# Patient Record
Sex: Female | Born: 1985 | Race: White | Hispanic: No | Marital: Married | Smoking: Never smoker
Health system: Southern US, Community
[De-identification: ages and names within clinical notes are randomized; demographics above are authoritative.]

## PROBLEM LIST (undated history)

## (undated) DIAGNOSIS — F988 Other specified behavioral and emotional disorders with onset usually occurring in childhood and adolescence: Secondary | ICD-10-CM

## (undated) DIAGNOSIS — E039 Hypothyroidism, unspecified: Secondary | ICD-10-CM

## (undated) DIAGNOSIS — R519 Headache, unspecified: Secondary | ICD-10-CM

## (undated) DIAGNOSIS — Z8489 Family history of other specified conditions: Secondary | ICD-10-CM

## (undated) DIAGNOSIS — R278 Other lack of coordination: Secondary | ICD-10-CM

## (undated) DIAGNOSIS — N809 Endometriosis, unspecified: Secondary | ICD-10-CM

## (undated) HISTORY — DX: Endometriosis, unspecified: N80.9

## (undated) HISTORY — PX: OTHER SURGICAL HISTORY: SHX169

## (undated) HISTORY — DX: Hypothyroidism, unspecified: E03.9

## (undated) HISTORY — DX: Other specified behavioral and emotional disorders with onset usually occurring in childhood and adolescence: F98.8

## (undated) HISTORY — DX: Other lack of coordination: R27.8

## (undated) HISTORY — DX: Family history of other specified conditions: Z84.89

## (undated) HISTORY — PX: BREAST SURGERY: SHX581

## (undated) HISTORY — DX: Headache, unspecified: R51.9

---

## 2020-09-05 ENCOUNTER — Inpatient Hospital Stay (HOSPITAL_COMMUNITY): Admit: 2020-09-05 | Payer: BC Managed Care – PPO | Admitting: Obstetrics & Gynecology

## 2020-10-11 ENCOUNTER — Other Ambulatory Visit: Payer: Self-pay | Admitting: Obstetrics & Gynecology

## 2020-10-11 DIAGNOSIS — O283 Abnormal ultrasonic finding on antenatal screening of mother: Secondary | ICD-10-CM

## 2020-10-19 ENCOUNTER — Encounter: Payer: Self-pay | Admitting: *Deleted

## 2020-10-24 ENCOUNTER — Ambulatory Visit: Payer: BC Managed Care – PPO | Admitting: *Deleted

## 2020-10-24 ENCOUNTER — Encounter: Payer: Self-pay | Admitting: *Deleted

## 2020-10-24 ENCOUNTER — Ambulatory Visit: Payer: BC Managed Care – PPO

## 2020-10-24 ENCOUNTER — Other Ambulatory Visit: Payer: Self-pay | Admitting: *Deleted

## 2020-10-24 ENCOUNTER — Other Ambulatory Visit: Payer: Self-pay

## 2020-10-24 ENCOUNTER — Ambulatory Visit: Payer: BC Managed Care – PPO | Attending: Obstetrics & Gynecology

## 2020-10-24 VITALS — BP 131/76 | HR 98 | Ht 66.5 in

## 2020-10-24 DIAGNOSIS — O09522 Supervision of elderly multigravida, second trimester: Secondary | ICD-10-CM

## 2020-10-24 DIAGNOSIS — Z3683 Encounter for fetal screening for congenital cardiac abnormalities: Secondary | ICD-10-CM

## 2020-10-24 DIAGNOSIS — O99282 Endocrine, nutritional and metabolic diseases complicating pregnancy, second trimester: Secondary | ICD-10-CM

## 2020-10-24 DIAGNOSIS — E669 Obesity, unspecified: Secondary | ICD-10-CM | POA: Diagnosis not present

## 2020-10-24 DIAGNOSIS — O283 Abnormal ultrasonic finding on antenatal screening of mother: Secondary | ICD-10-CM | POA: Diagnosis present

## 2020-10-24 DIAGNOSIS — E039 Hypothyroidism, unspecified: Secondary | ICD-10-CM

## 2020-10-24 DIAGNOSIS — O99212 Obesity complicating pregnancy, second trimester: Secondary | ICD-10-CM | POA: Diagnosis not present

## 2020-10-24 DIAGNOSIS — O09512 Supervision of elderly primigravida, second trimester: Secondary | ICD-10-CM | POA: Diagnosis present

## 2020-10-24 DIAGNOSIS — Z362 Encounter for other antenatal screening follow-up: Secondary | ICD-10-CM

## 2020-10-24 DIAGNOSIS — Z3A25 25 weeks gestation of pregnancy: Secondary | ICD-10-CM

## 2020-10-24 DIAGNOSIS — Z363 Encounter for antenatal screening for malformations: Secondary | ICD-10-CM

## 2020-11-05 ENCOUNTER — Encounter: Payer: Self-pay | Admitting: *Deleted

## 2020-11-05 ENCOUNTER — Ambulatory Visit: Payer: BC Managed Care – PPO | Attending: Obstetrics and Gynecology

## 2020-11-05 ENCOUNTER — Ambulatory Visit: Payer: BC Managed Care – PPO | Admitting: *Deleted

## 2020-11-05 ENCOUNTER — Other Ambulatory Visit: Payer: Self-pay

## 2020-11-05 VITALS — BP 133/81 | HR 98

## 2020-11-05 DIAGNOSIS — O99212 Obesity complicating pregnancy, second trimester: Secondary | ICD-10-CM | POA: Diagnosis not present

## 2020-11-05 DIAGNOSIS — Z362 Encounter for other antenatal screening follow-up: Secondary | ICD-10-CM | POA: Diagnosis not present

## 2020-11-05 DIAGNOSIS — O99282 Endocrine, nutritional and metabolic diseases complicating pregnancy, second trimester: Secondary | ICD-10-CM

## 2020-11-05 DIAGNOSIS — O09522 Supervision of elderly multigravida, second trimester: Secondary | ICD-10-CM | POA: Diagnosis not present

## 2020-11-05 DIAGNOSIS — Z3A27 27 weeks gestation of pregnancy: Secondary | ICD-10-CM

## 2020-11-05 DIAGNOSIS — E039 Hypothyroidism, unspecified: Secondary | ICD-10-CM

## 2020-11-05 DIAGNOSIS — E669 Obesity, unspecified: Secondary | ICD-10-CM | POA: Diagnosis not present

## 2020-11-05 DIAGNOSIS — O09512 Supervision of elderly primigravida, second trimester: Secondary | ICD-10-CM | POA: Diagnosis present

## 2020-11-05 DIAGNOSIS — Z363 Encounter for antenatal screening for malformations: Secondary | ICD-10-CM

## 2020-11-05 DIAGNOSIS — Z3683 Encounter for fetal screening for congenital cardiac abnormalities: Secondary | ICD-10-CM

## 2020-11-06 ENCOUNTER — Other Ambulatory Visit: Payer: Self-pay | Admitting: *Deleted

## 2020-11-06 DIAGNOSIS — Q212 Atrioventricular septal defect, unspecified as to partial or complete: Secondary | ICD-10-CM

## 2020-11-19 ENCOUNTER — Ambulatory Visit: Payer: BC Managed Care – PPO | Admitting: *Deleted

## 2020-11-19 ENCOUNTER — Ambulatory Visit: Payer: BC Managed Care – PPO | Attending: Obstetrics and Gynecology

## 2020-11-19 ENCOUNTER — Encounter: Payer: Self-pay | Admitting: *Deleted

## 2020-11-19 ENCOUNTER — Other Ambulatory Visit: Payer: Self-pay

## 2020-11-19 VITALS — BP 137/89 | HR 105

## 2020-11-19 DIAGNOSIS — E039 Hypothyroidism, unspecified: Secondary | ICD-10-CM

## 2020-11-19 DIAGNOSIS — O99283 Endocrine, nutritional and metabolic diseases complicating pregnancy, third trimester: Secondary | ICD-10-CM

## 2020-11-19 DIAGNOSIS — O99213 Obesity complicating pregnancy, third trimester: Secondary | ICD-10-CM

## 2020-11-19 DIAGNOSIS — O358XX0 Maternal care for other (suspected) fetal abnormality and damage, not applicable or unspecified: Secondary | ICD-10-CM | POA: Diagnosis not present

## 2020-11-19 DIAGNOSIS — Q212 Atrioventricular septal defect, unspecified as to partial or complete: Secondary | ICD-10-CM

## 2020-11-19 DIAGNOSIS — Z362 Encounter for other antenatal screening follow-up: Secondary | ICD-10-CM

## 2020-11-19 DIAGNOSIS — O09513 Supervision of elderly primigravida, third trimester: Secondary | ICD-10-CM | POA: Diagnosis not present

## 2020-11-19 DIAGNOSIS — O403XX0 Polyhydramnios, third trimester, not applicable or unspecified: Secondary | ICD-10-CM

## 2020-11-19 DIAGNOSIS — E669 Obesity, unspecified: Secondary | ICD-10-CM

## 2020-11-19 DIAGNOSIS — Z3A29 29 weeks gestation of pregnancy: Secondary | ICD-10-CM

## 2020-11-20 ENCOUNTER — Other Ambulatory Visit: Payer: Self-pay | Admitting: *Deleted

## 2020-11-20 DIAGNOSIS — Q212 Atrioventricular septal defect, unspecified as to partial or complete: Secondary | ICD-10-CM

## 2020-11-28 ENCOUNTER — Other Ambulatory Visit: Payer: Self-pay

## 2020-11-28 ENCOUNTER — Encounter: Payer: BC Managed Care – PPO | Attending: Obstetrics & Gynecology | Admitting: Registered"

## 2020-11-28 DIAGNOSIS — O24419 Gestational diabetes mellitus in pregnancy, unspecified control: Secondary | ICD-10-CM

## 2020-11-28 NOTE — Progress Notes (Signed)
Patient was seen on 11/28/20 for Gestational Diabetes self-management class at the [facility]. The following learning objectives were met by the patient during this course:   States the definition of Gestational Diabetes  States why dietary management is important in controlling blood glucose  Describes the effects each nutrient has on blood glucose levels  Demonstrates ability to create a balanced meal plan  Demonstrates carbohydrate counting   States when to check blood glucose levels  Demonstrates proper blood glucose monitoring techniques  States the effect of stress and exercise on blood glucose levels  States the importance of limiting caffeine and abstaining from alcohol and smoking  Blood glucose monitor given: OneTouch Verio Reflect Lot# [id number] Exp: 10/21/2021 Blood Glucose: 81 mg/dL  Patient instructed to monitor glucose levels: FBS: 60 - <95; 1 hour: <140; 2 hour: <120  Patient received handouts:  Nutrition Diabetes and Pregnancy, including carb counting list  Patient will be seen for follow-up as needed. 

## 2020-11-30 ENCOUNTER — Telehealth: Payer: Self-pay

## 2020-12-03 ENCOUNTER — Encounter: Payer: Self-pay | Admitting: Registered"

## 2020-12-03 DIAGNOSIS — O24419 Gestational diabetes mellitus in pregnancy, unspecified control: Secondary | ICD-10-CM | POA: Insufficient documentation

## 2020-12-03 NOTE — Telephone Encounter (Signed)
Called [facility]   Patient scheduled for Fetal Echo on 11/07/20   Patient also schedule for a follow up Fetal Echo on 12/05/20 @ 9am

## 2020-12-05 ENCOUNTER — Encounter: Payer: Self-pay | Admitting: Pediatric Cardiology

## 2020-12-11 ENCOUNTER — Ambulatory Visit: Payer: BC Managed Care – PPO | Attending: Obstetrics and Gynecology

## 2020-12-11 ENCOUNTER — Encounter: Payer: Self-pay | Admitting: *Deleted

## 2020-12-11 ENCOUNTER — Other Ambulatory Visit: Payer: Self-pay

## 2020-12-11 ENCOUNTER — Ambulatory Visit: Payer: BC Managed Care – PPO | Admitting: *Deleted

## 2020-12-11 VITALS — BP 127/69 | HR 84

## 2020-12-11 DIAGNOSIS — O09513 Supervision of elderly primigravida, third trimester: Secondary | ICD-10-CM

## 2020-12-11 DIAGNOSIS — Z3A32 32 weeks gestation of pregnancy: Secondary | ICD-10-CM

## 2020-12-11 DIAGNOSIS — O4403 Placenta previa specified as without hemorrhage, third trimester: Secondary | ICD-10-CM | POA: Diagnosis not present

## 2020-12-11 DIAGNOSIS — E039 Hypothyroidism, unspecified: Secondary | ICD-10-CM

## 2020-12-11 DIAGNOSIS — O358XX0 Maternal care for other (suspected) fetal abnormality and damage, not applicable or unspecified: Secondary | ICD-10-CM | POA: Diagnosis not present

## 2020-12-11 DIAGNOSIS — Q212 Atrioventricular septal defect, unspecified as to partial or complete: Secondary | ICD-10-CM

## 2020-12-11 DIAGNOSIS — O99283 Endocrine, nutritional and metabolic diseases complicating pregnancy, third trimester: Secondary | ICD-10-CM

## 2020-12-11 DIAGNOSIS — O24410 Gestational diabetes mellitus in pregnancy, diet controlled: Secondary | ICD-10-CM | POA: Insufficient documentation

## 2020-12-11 DIAGNOSIS — O99213 Obesity complicating pregnancy, third trimester: Secondary | ICD-10-CM

## 2020-12-11 DIAGNOSIS — E669 Obesity, unspecified: Secondary | ICD-10-CM

## 2020-12-12 ENCOUNTER — Other Ambulatory Visit: Payer: Self-pay | Admitting: *Deleted

## 2020-12-12 DIAGNOSIS — O35BXX0 Maternal care for other (suspected) fetal abnormality and damage, fetal cardiac anomalies, not applicable or unspecified: Secondary | ICD-10-CM

## 2020-12-19 ENCOUNTER — Ambulatory Visit: Payer: BC Managed Care – PPO | Attending: Obstetrics and Gynecology

## 2020-12-19 ENCOUNTER — Other Ambulatory Visit: Payer: Self-pay

## 2020-12-19 ENCOUNTER — Other Ambulatory Visit: Payer: Self-pay | Admitting: Obstetrics

## 2020-12-19 ENCOUNTER — Ambulatory Visit: Payer: BC Managed Care – PPO | Admitting: *Deleted

## 2020-12-19 ENCOUNTER — Encounter: Payer: Self-pay | Admitting: *Deleted

## 2020-12-19 VITALS — BP 110/80 | HR 100

## 2020-12-19 DIAGNOSIS — O09513 Supervision of elderly primigravida, third trimester: Secondary | ICD-10-CM

## 2020-12-19 DIAGNOSIS — Q212 Atrioventricular septal defect, unspecified as to partial or complete: Secondary | ICD-10-CM

## 2020-12-19 DIAGNOSIS — E039 Hypothyroidism, unspecified: Secondary | ICD-10-CM

## 2020-12-19 DIAGNOSIS — O99213 Obesity complicating pregnancy, third trimester: Secondary | ICD-10-CM

## 2020-12-19 DIAGNOSIS — O4403 Placenta previa specified as without hemorrhage, third trimester: Secondary | ICD-10-CM

## 2020-12-19 DIAGNOSIS — O358XX0 Maternal care for other (suspected) fetal abnormality and damage, not applicable or unspecified: Secondary | ICD-10-CM

## 2020-12-19 DIAGNOSIS — Z3A33 33 weeks gestation of pregnancy: Secondary | ICD-10-CM

## 2020-12-19 DIAGNOSIS — E669 Obesity, unspecified: Secondary | ICD-10-CM

## 2020-12-19 DIAGNOSIS — O99283 Endocrine, nutritional and metabolic diseases complicating pregnancy, third trimester: Secondary | ICD-10-CM

## 2020-12-20 ENCOUNTER — Other Ambulatory Visit: Payer: Self-pay | Admitting: *Deleted

## 2020-12-20 DIAGNOSIS — O4403 Placenta previa specified as without hemorrhage, third trimester: Secondary | ICD-10-CM

## 2020-12-28 ENCOUNTER — Ambulatory Visit: Payer: BC Managed Care – PPO | Admitting: *Deleted

## 2020-12-28 ENCOUNTER — Encounter: Payer: Self-pay | Admitting: *Deleted

## 2020-12-28 ENCOUNTER — Ambulatory Visit: Payer: BC Managed Care – PPO | Attending: Obstetrics and Gynecology

## 2020-12-28 ENCOUNTER — Other Ambulatory Visit: Payer: Self-pay

## 2020-12-28 ENCOUNTER — Other Ambulatory Visit: Payer: Self-pay | Admitting: *Deleted

## 2020-12-28 VITALS — BP 126/78 | HR 75

## 2020-12-28 DIAGNOSIS — O358XX0 Maternal care for other (suspected) fetal abnormality and damage, not applicable or unspecified: Secondary | ICD-10-CM | POA: Diagnosis not present

## 2020-12-28 DIAGNOSIS — O99213 Obesity complicating pregnancy, third trimester: Secondary | ICD-10-CM | POA: Diagnosis not present

## 2020-12-28 DIAGNOSIS — O99283 Endocrine, nutritional and metabolic diseases complicating pregnancy, third trimester: Secondary | ICD-10-CM

## 2020-12-28 DIAGNOSIS — O09513 Supervision of elderly primigravida, third trimester: Secondary | ICD-10-CM

## 2020-12-28 DIAGNOSIS — O4403 Placenta previa specified as without hemorrhage, third trimester: Secondary | ICD-10-CM | POA: Diagnosis not present

## 2020-12-28 DIAGNOSIS — O35BXX0 Maternal care for other (suspected) fetal abnormality and damage, fetal cardiac anomalies, not applicable or unspecified: Secondary | ICD-10-CM

## 2020-12-28 DIAGNOSIS — Z3A34 34 weeks gestation of pregnancy: Secondary | ICD-10-CM

## 2020-12-28 DIAGNOSIS — E039 Hypothyroidism, unspecified: Secondary | ICD-10-CM

## 2020-12-28 DIAGNOSIS — E669 Obesity, unspecified: Secondary | ICD-10-CM

## 2021-01-04 ENCOUNTER — Ambulatory Visit: Payer: BC Managed Care – PPO

## 2021-01-08 ENCOUNTER — Ambulatory Visit: Payer: BC Managed Care – PPO | Admitting: *Deleted

## 2021-01-08 ENCOUNTER — Other Ambulatory Visit: Payer: Self-pay | Admitting: Obstetrics and Gynecology

## 2021-01-08 ENCOUNTER — Other Ambulatory Visit: Payer: Self-pay

## 2021-01-08 ENCOUNTER — Encounter: Payer: Self-pay | Admitting: *Deleted

## 2021-01-08 ENCOUNTER — Ambulatory Visit: Payer: BC Managed Care – PPO | Attending: Obstetrics and Gynecology

## 2021-01-08 VITALS — BP 135/77 | HR 79

## 2021-01-08 DIAGNOSIS — Z3A36 36 weeks gestation of pregnancy: Secondary | ICD-10-CM

## 2021-01-08 DIAGNOSIS — E669 Obesity, unspecified: Secondary | ICD-10-CM

## 2021-01-08 DIAGNOSIS — O09513 Supervision of elderly primigravida, third trimester: Secondary | ICD-10-CM

## 2021-01-08 DIAGNOSIS — O358XX0 Maternal care for other (suspected) fetal abnormality and damage, not applicable or unspecified: Secondary | ICD-10-CM

## 2021-01-08 DIAGNOSIS — O4403 Placenta previa specified as without hemorrhage, third trimester: Secondary | ICD-10-CM

## 2021-01-08 DIAGNOSIS — O35BXX0 Maternal care for other (suspected) fetal abnormality and damage, fetal cardiac anomalies, not applicable or unspecified: Secondary | ICD-10-CM

## 2021-01-08 DIAGNOSIS — E039 Hypothyroidism, unspecified: Secondary | ICD-10-CM | POA: Insufficient documentation

## 2021-01-08 DIAGNOSIS — O99213 Obesity complicating pregnancy, third trimester: Secondary | ICD-10-CM | POA: Diagnosis not present

## 2021-01-08 DIAGNOSIS — O99283 Endocrine, nutritional and metabolic diseases complicating pregnancy, third trimester: Secondary | ICD-10-CM

## 2021-01-08 NOTE — Procedures (Signed)
Heather Neal 1986/10/02 [redacted]w[redacted]d  Fetus A Non-Stress Test Interpretation for 01/08/21  Indication: Fetal cardiac defect  Fetal Heart Rate A Mode: External Baseline Rate (A): 125 bpm Variability: Moderate Accelerations: 15 x 15 Decelerations: None Multiple birth?: No  Uterine Activity Mode: Palpation,Toco Contraction Frequency (min): None Resting Tone Palpated: Relaxed Resting Time: Adequate  Interpretation (Fetal Testing) Nonstress Test Interpretation: Reactive Comments: Dr. Judeth Cornfield  reviewed tracing.

## 2021-01-09 ENCOUNTER — Other Ambulatory Visit: Payer: Self-pay | Admitting: *Deleted

## 2021-01-09 DIAGNOSIS — E039 Hypothyroidism, unspecified: Secondary | ICD-10-CM

## 2021-01-15 ENCOUNTER — Ambulatory Visit: Payer: BC Managed Care – PPO

## 2021-01-15 ENCOUNTER — Telehealth: Payer: Self-pay

## 2021-01-15 NOTE — Telephone Encounter (Signed)
Patient called in and cancelled her upcoming appointments because she is going to get her BPP's done at her OB office.   Note has been routed to MFM docs to notify them of the change.

## 2021-01-22 ENCOUNTER — Ambulatory Visit: Payer: BC Managed Care – PPO

## 2021-01-24 NOTE — Telephone Encounter (Signed)
Preadmission screen  

## 2021-01-29 ENCOUNTER — Other Ambulatory Visit (HOSPITAL_COMMUNITY)
Admission: RE | Admit: 2021-01-29 | Discharge: 2021-01-29 | Disposition: A | Discharge status: Home / Self Care | Payer: BC Managed Care – PPO | Source: Ambulatory Visit | Attending: Obstetrics & Gynecology | Admitting: Obstetrics & Gynecology

## 2021-01-29 DIAGNOSIS — Z20822 Contact with and (suspected) exposure to covid-19: Secondary | ICD-10-CM | POA: Insufficient documentation

## 2021-01-29 DIAGNOSIS — Z01812 Encounter for preprocedural laboratory examination: Secondary | ICD-10-CM | POA: Insufficient documentation

## 2021-01-29 LAB — SARS CORONAVIRUS 2 (TAT 6-24 HRS): SARS Coronavirus 2: NEGATIVE

## 2021-01-31 ENCOUNTER — Inpatient Hospital Stay (HOSPITAL_COMMUNITY): Payer: BC Managed Care – PPO | Admitting: Certified Registered Nurse Anesthetist

## 2021-01-31 ENCOUNTER — Inpatient Hospital Stay (HOSPITAL_COMMUNITY)
Admission: AD | Admit: 2021-01-31 | Discharge: 2021-02-01 | DRG: 788 | Disposition: A | Discharge status: Home / Self Care | Payer: BC Managed Care – PPO | Attending: Obstetrics & Gynecology | Admitting: Obstetrics & Gynecology

## 2021-01-31 ENCOUNTER — Encounter (HOSPITAL_COMMUNITY): Payer: Self-pay | Admitting: Obstetrics & Gynecology

## 2021-01-31 ENCOUNTER — Other Ambulatory Visit: Payer: Self-pay

## 2021-01-31 ENCOUNTER — Inpatient Hospital Stay (HOSPITAL_COMMUNITY): Payer: BC Managed Care – PPO

## 2021-01-31 ENCOUNTER — Encounter (HOSPITAL_COMMUNITY)
Admission: AD | Disposition: A | Discharge status: Home / Self Care | Payer: Self-pay | Source: Home / Self Care | Attending: Obstetrics & Gynecology

## 2021-01-31 DIAGNOSIS — E039 Hypothyroidism, unspecified: Secondary | ICD-10-CM | POA: Diagnosis present

## 2021-01-31 DIAGNOSIS — O24420 Gestational diabetes mellitus in childbirth, diet controlled: Secondary | ICD-10-CM | POA: Diagnosis present

## 2021-01-31 DIAGNOSIS — Z20822 Contact with and (suspected) exposure to covid-19: Secondary | ICD-10-CM | POA: Diagnosis present

## 2021-01-31 DIAGNOSIS — Z6791 Unspecified blood type, Rh negative: Secondary | ICD-10-CM

## 2021-01-31 DIAGNOSIS — O24419 Gestational diabetes mellitus in pregnancy, unspecified control: Secondary | ICD-10-CM | POA: Diagnosis present

## 2021-01-31 DIAGNOSIS — O358XX0 Maternal care for other (suspected) fetal abnormality and damage, not applicable or unspecified: Secondary | ICD-10-CM | POA: Diagnosis present

## 2021-01-31 DIAGNOSIS — O26893 Other specified pregnancy related conditions, third trimester: Secondary | ICD-10-CM | POA: Diagnosis present

## 2021-01-31 DIAGNOSIS — Z3A39 39 weeks gestation of pregnancy: Secondary | ICD-10-CM | POA: Diagnosis not present

## 2021-01-31 DIAGNOSIS — O99284 Endocrine, nutritional and metabolic diseases complicating childbirth: Secondary | ICD-10-CM | POA: Diagnosis present

## 2021-01-31 LAB — TYPE AND SCREEN
ABO/RH(D): O NEG
Antibody Screen: NEGATIVE

## 2021-01-31 LAB — CBC
HCT: 33.3 % — ABNORMAL LOW (ref 36.0–46.0)
Hemoglobin: 11.5 g/dL — ABNORMAL LOW (ref 12.0–15.0)
MCH: 31.2 pg (ref 26.0–34.0)
MCHC: 34.5 g/dL (ref 30.0–36.0)
MCV: 90.2 fL (ref 80.0–100.0)
Platelets: 172 10*3/uL (ref 150–400)
RBC: 3.69 MIL/uL — ABNORMAL LOW (ref 3.87–5.11)
RDW: 13.1 % (ref 11.5–15.5)
WBC: 12.6 10*3/uL — ABNORMAL HIGH (ref 4.0–10.5)
nRBC: 0 % (ref 0.0–0.2)

## 2021-01-31 LAB — GLUCOSE, CAPILLARY
Glucose-Capillary: 99 mg/dL (ref 70–99)
Glucose-Capillary: 77 mg/dL (ref 70–99)
Glucose-Capillary: 70 mg/dL (ref 70–99)
Glucose-Capillary: 82 mg/dL (ref 70–99)

## 2021-01-31 LAB — RPR: RPR Ser Ql: NONREACTIVE

## 2021-01-31 SURGERY — Surgical Case
Anesthesia: Spinal

## 2021-01-31 MED ORDER — SODIUM CHLORIDE 0.9% FLUSH: 3.0000 mL | INTRAVENOUS | Status: DC | PRN

## 2021-01-31 MED ORDER — FENTANYL CITRATE (PF) 100 MCG/2ML IJ SOLN
INTRAMUSCULAR | Status: DC | PRN
  Administered 2021-01-31: 15 ug via INTRATHECAL

## 2021-01-31 MED ORDER — PROMETHAZINE HCL 25 MG/ML IJ SOLN: 6.2500 mg | INTRAMUSCULAR | Status: DC | PRN

## 2021-01-31 MED ORDER — NALBUPHINE HCL 10 MG/ML IJ SOLN: 5.0000 mg | INTRAMUSCULAR | Status: DC | PRN

## 2021-01-31 MED ORDER — STERILE WATER FOR IRRIGATION IR SOLN
Status: DC | PRN
  Administered 2021-01-31: 1

## 2021-01-31 MED ORDER — SODIUM CHLORIDE 0.9 % IR SOLN
Status: DC | PRN
  Administered 2021-01-31: 1

## 2021-01-31 MED ORDER — LACTATED RINGERS IV SOLN
500.0000 mL | INTRAVENOUS | Status: DC | PRN
  Administered 2021-01-31 (×2): 500 mL via INTRAVENOUS

## 2021-01-31 MED ORDER — OXYCODONE-ACETAMINOPHEN 5-325 MG PO TABS: 2.0000 | ORAL_TABLET | ORAL | Status: DC | PRN

## 2021-01-31 MED ORDER — FENTANYL CITRATE (PF) 100 MCG/2ML IJ SOLN: 50.0000 ug | INTRAMUSCULAR | Status: DC | PRN

## 2021-01-31 MED ORDER — TERBUTALINE SULFATE 1 MG/ML IJ SOLN
0.2500 mg | Freq: Once | INTRAMUSCULAR | Status: AC | PRN
  Administered 2021-01-31: 0.25 mg via SUBCUTANEOUS
  Filled 2021-01-31: qty 1

## 2021-01-31 MED ORDER — OXYCODONE HCL 5 MG PO TABS: 5.0000 mg | ORAL_TABLET | ORAL | Status: DC | PRN

## 2021-01-31 MED ORDER — OXYTOCIN-SODIUM CHLORIDE 30-0.9 UT/500ML-% IV SOLN: 2.5000 [IU]/h | INTRAVENOUS | Status: DC

## 2021-01-31 MED ORDER — MORPHINE SULFATE (PF) 0.5 MG/ML IJ SOLN
INTRAMUSCULAR | Status: AC
  Filled 2021-01-31: qty 10

## 2021-01-31 MED ORDER — DEXAMETHASONE SODIUM PHOSPHATE 4 MG/ML IJ SOLN
INTRAMUSCULAR | Status: DC | PRN
  Administered 2021-01-31: 4 mg via INTRAVENOUS

## 2021-01-31 MED ORDER — MISOPROSTOL 25 MCG QUARTER TABLET
25.0000 ug | ORAL_TABLET | ORAL | Status: DC | PRN
  Administered 2021-01-31: 25 ug via VAGINAL
  Filled 2021-01-31: qty 1

## 2021-01-31 MED ORDER — CEFAZOLIN SODIUM-DEXTROSE 2-4 GM/100ML-% IV SOLN
2.0000 g | INTRAVENOUS | Status: AC
  Administered 2021-01-31: 2 g via INTRAVENOUS

## 2021-01-31 MED ORDER — PRENATAL MULTIVITAMIN CH
1.0000 | ORAL_TABLET | Freq: Every day | ORAL | Status: DC
  Administered 2021-02-01: 1 via ORAL
  Filled 2021-01-31: qty 1

## 2021-01-31 MED ORDER — ACETAMINOPHEN 325 MG PO TABS: 650.0000 mg | ORAL_TABLET | ORAL | Status: DC | PRN

## 2021-01-31 MED ORDER — PHENYLEPHRINE HCL-NACL 20-0.9 MG/250ML-% IV SOLN
INTRAVENOUS | Status: AC
  Filled 2021-01-31: qty 250

## 2021-01-31 MED ORDER — WITCH HAZEL-GLYCERIN EX PADS: 1.0000 "application " | MEDICATED_PAD | CUTANEOUS | Status: DC | PRN

## 2021-01-31 MED ORDER — SOD CITRATE-CITRIC ACID 500-334 MG/5ML PO SOLN
30.0000 mL | ORAL | Status: DC | PRN
  Administered 2021-01-31: 30 mL via ORAL
  Filled 2021-01-31: qty 15

## 2021-01-31 MED ORDER — DIPHENHYDRAMINE HCL 50 MG/ML IJ SOLN: 12.5000 mg | Freq: Four times a day (QID) | INTRAMUSCULAR | Status: DC | PRN

## 2021-01-31 MED ORDER — NALBUPHINE HCL 10 MG/ML IJ SOLN: 5.0000 mg | Freq: Once | INTRAMUSCULAR | Status: DC | PRN

## 2021-01-31 MED ORDER — FENTANYL CITRATE (PF) 100 MCG/2ML IJ SOLN
INTRAMUSCULAR | Status: AC
  Filled 2021-01-31: qty 2

## 2021-01-31 MED ORDER — ONDANSETRON HCL 4 MG/2ML IJ SOLN
INTRAMUSCULAR | Status: DC | PRN
  Administered 2021-01-31: 4 mg via INTRAVENOUS

## 2021-01-31 MED ORDER — COCONUT OIL OIL: 1.0000 "application " | TOPICAL_OIL | Status: DC | PRN

## 2021-01-31 MED ORDER — NALOXONE HCL 0.4 MG/ML IJ SOLN: 0.4000 mg | INTRAMUSCULAR | Status: DC | PRN

## 2021-01-31 MED ORDER — IBUPROFEN 800 MG PO TABS
800.0000 mg | ORAL_TABLET | Freq: Four times a day (QID) | ORAL | Status: DC
  Administered 2021-01-31 – 2021-02-01 (×4): 800 mg via ORAL
  Filled 2021-01-31 (×4): qty 1

## 2021-01-31 MED ORDER — PHENYLEPHRINE HCL (PRESSORS) 10 MG/ML IV SOLN
INTRAVENOUS | Status: DC | PRN
  Administered 2021-01-31 (×3): 40 ug via INTRAVENOUS

## 2021-01-31 MED ORDER — TETANUS-DIPHTH-ACELL PERTUSSIS 5-2.5-18.5 LF-MCG/0.5 IM SUSY: 0.5000 mL | PREFILLED_SYRINGE | Freq: Once | INTRAMUSCULAR | Status: DC

## 2021-01-31 MED ORDER — OXYTOCIN BOLUS FROM INFUSION: 333.0000 mL | Freq: Once | INTRAVENOUS | Status: DC

## 2021-01-31 MED ORDER — ACETAMINOPHEN 325 MG PO TABS
650.0000 mg | ORAL_TABLET | ORAL | Status: DC | PRN
Start: 2021-01-31 — End: 2021-02-01
  Administered 2021-01-31: 650 mg via ORAL

## 2021-01-31 MED ORDER — MENTHOL 3 MG MT LOZG: 1.0000 | LOZENGE | OROMUCOSAL | Status: DC | PRN

## 2021-01-31 MED ORDER — PHENYLEPHRINE HCL-NACL 20-0.9 MG/250ML-% IV SOLN
INTRAVENOUS | Status: DC | PRN
  Administered 2021-01-31: 60 ug/min via INTRAVENOUS

## 2021-01-31 MED ORDER — SENNOSIDES-DOCUSATE SODIUM 8.6-50 MG PO TABS
2.0000 | ORAL_TABLET | Freq: Every day | ORAL | Status: DC
  Administered 2021-02-01: 2 via ORAL
  Filled 2021-01-31: qty 2

## 2021-01-31 MED ORDER — OXYCODONE-ACETAMINOPHEN 5-325 MG PO TABS: 1.0000 | ORAL_TABLET | ORAL | Status: DC | PRN

## 2021-01-31 MED ORDER — ONDANSETRON HCL 4 MG/2ML IJ SOLN: 4.0000 mg | Freq: Four times a day (QID) | INTRAMUSCULAR | Status: DC | PRN

## 2021-01-31 MED ORDER — DIPHENHYDRAMINE HCL 25 MG PO CAPS: 25.0000 mg | ORAL_CAPSULE | Freq: Four times a day (QID) | ORAL | Status: DC | PRN

## 2021-01-31 MED ORDER — OXYTOCIN-SODIUM CHLORIDE 30-0.9 UT/500ML-% IV SOLN
INTRAVENOUS | Status: DC | PRN
  Administered 2021-01-31: 30 [IU] via INTRAVENOUS

## 2021-01-31 MED ORDER — NALOXONE HCL 4 MG/10ML IJ SOLN
1.0000 ug/kg/h | INTRAVENOUS | Status: DC | PRN
  Filled 2021-01-31: qty 5

## 2021-01-31 MED ORDER — BUPIVACAINE IN DEXTROSE 0.75-8.25 % IT SOLN
INTRATHECAL | Status: DC | PRN
  Administered 2021-01-31: 1.6 mL via INTRATHECAL

## 2021-01-31 MED ORDER — KETOROLAC TROMETHAMINE 30 MG/ML IJ SOLN: 30.0000 mg | Freq: Four times a day (QID) | INTRAMUSCULAR | Status: AC | PRN

## 2021-01-31 MED ORDER — SIMETHICONE 80 MG PO CHEW: 80.0000 mg | CHEWABLE_TABLET | ORAL | Status: DC | PRN

## 2021-01-31 MED ORDER — ONDANSETRON HCL 4 MG/2ML IJ SOLN: 4.0000 mg | Freq: Three times a day (TID) | INTRAMUSCULAR | Status: DC | PRN

## 2021-01-31 MED ORDER — KETOROLAC TROMETHAMINE 30 MG/ML IJ SOLN
30.0000 mg | Freq: Once | INTRAMUSCULAR | Status: AC
  Administered 2021-01-31: 30 mg via INTRAVENOUS

## 2021-01-31 MED ORDER — ACETAMINOPHEN 500 MG PO TABS: 1000.0000 mg | ORAL_TABLET | Freq: Once | ORAL | Status: DC

## 2021-01-31 MED ORDER — OXYTOCIN-SODIUM CHLORIDE 30-0.9 UT/500ML-% IV SOLN
INTRAVENOUS | Status: AC
  Filled 2021-01-31: qty 500

## 2021-01-31 MED ORDER — ACETAMINOPHEN 160 MG/5ML PO SOLN: 1000.0000 mg | Freq: Once | ORAL | Status: DC

## 2021-01-31 MED ORDER — LIDOCAINE HCL (PF) 1 % IJ SOLN: 30.0000 mL | INTRAMUSCULAR | Status: DC | PRN

## 2021-01-31 MED ORDER — SIMETHICONE 80 MG PO CHEW
80.0000 mg | CHEWABLE_TABLET | Freq: Three times a day (TID) | ORAL | Status: DC
  Administered 2021-01-31 – 2021-02-01 (×2): 80 mg via ORAL
  Filled 2021-01-31 (×2): qty 1

## 2021-01-31 MED ORDER — LEVOTHYROXINE SODIUM 75 MCG PO TABS
75.0000 ug | ORAL_TABLET | Freq: Every day | ORAL | Status: DC
  Administered 2021-02-01: 75 ug via ORAL
  Filled 2021-01-31: qty 1

## 2021-01-31 MED ORDER — KETOROLAC TROMETHAMINE 30 MG/ML IJ SOLN
INTRAMUSCULAR | Status: AC
  Filled 2021-01-31: qty 1

## 2021-01-31 MED ORDER — OXYTOCIN-SODIUM CHLORIDE 30-0.9 UT/500ML-% IV SOLN: 2.5000 [IU]/h | INTRAVENOUS | Status: AC

## 2021-01-31 MED ORDER — FENTANYL CITRATE (PF) 100 MCG/2ML IJ SOLN
25.0000 ug | INTRAMUSCULAR | Status: DC | PRN
Start: 2021-01-31 — End: 2021-01-31

## 2021-01-31 MED ORDER — ACETAMINOPHEN 500 MG PO TABS
1000.0000 mg | ORAL_TABLET | Freq: Four times a day (QID) | ORAL | Status: DC
  Administered 2021-01-31 – 2021-02-01 (×3): 1000 mg via ORAL
  Filled 2021-01-31 (×4): qty 2

## 2021-01-31 MED ORDER — LACTATED RINGERS IV SOLN: INTRAVENOUS | Status: DC

## 2021-01-31 MED ORDER — ZOLPIDEM TARTRATE 5 MG PO TABS: 5.0000 mg | ORAL_TABLET | Freq: Every evening | ORAL | Status: DC | PRN

## 2021-01-31 MED ORDER — MORPHINE SULFATE (PF) 0.5 MG/ML IJ SOLN
INTRAMUSCULAR | Status: DC | PRN
  Administered 2021-01-31: .15 mg via INTRATHECAL

## 2021-01-31 MED ORDER — DIBUCAINE (PERIANAL) 1 % EX OINT: 1.0000 "application " | TOPICAL_OINTMENT | CUTANEOUS | Status: DC | PRN

## 2021-01-31 MED ORDER — ONDANSETRON HCL 4 MG/2ML IJ SOLN
INTRAMUSCULAR | Status: AC
  Filled 2021-01-31: qty 2

## 2021-01-31 SURGICAL SUPPLY — 32 items
BENZOIN TINCTURE PRP APPL 2/3 (GAUZE/BANDAGES/DRESSINGS) ×2 IMPLANT
CHLORAPREP W/TINT 26ML (MISCELLANEOUS) ×2 IMPLANT
CLAMP CORD UMBIL (MISCELLANEOUS) IMPLANT
CLOTH BEACON ORANGE TIMEOUT ST (SAFETY) ×2 IMPLANT
DERMABOND ADVANCED (GAUZE/BANDAGES/DRESSINGS)
DERMABOND ADVANCED .7 DNX12 (GAUZE/BANDAGES/DRESSINGS) IMPLANT
DRSG OPSITE POSTOP 4X10 (GAUZE/BANDAGES/DRESSINGS) ×2 IMPLANT
ELECT REM PT RETURN 9FT ADLT (ELECTROSURGICAL) ×2
ELECTRODE REM PT RTRN 9FT ADLT (ELECTROSURGICAL) ×1 IMPLANT
EXTRACTOR VACUUM KIWI (MISCELLANEOUS) IMPLANT
GLOVE BIO SURGEON STRL SZ 6 (GLOVE) ×2 IMPLANT
GLOVE BIOGEL PI IND STRL 6 (GLOVE) ×2 IMPLANT
GLOVE BIOGEL PI IND STRL 7.0 (GLOVE) ×1 IMPLANT
GLOVE BIOGEL PI INDICATOR 6 (GLOVE) ×2
GLOVE BIOGEL PI INDICATOR 7.0 (GLOVE) ×1
GOWN STRL REUS W/TWL LRG LVL3 (GOWN DISPOSABLE) ×4 IMPLANT
KIT ABG SYR 3ML LUER SLIP (SYRINGE) ×2 IMPLANT
NEEDLE HYPO 25X5/8 SAFETYGLIDE (NEEDLE) ×2 IMPLANT
NS IRRIG 1000ML POUR BTL (IV SOLUTION) ×2 IMPLANT
PACK C SECTION WH (CUSTOM PROCEDURE TRAY) ×2 IMPLANT
PAD OB MATERNITY 4.3X12.25 (PERSONAL CARE ITEMS) ×2 IMPLANT
PENCIL SMOKE EVAC W/HOLSTER (ELECTROSURGICAL) ×2 IMPLANT
STRIP CLOSURE SKIN 1/2X4 (GAUZE/BANDAGES/DRESSINGS) ×2 IMPLANT
SUT CHROMIC 0 CTX 36 (SUTURE) ×8 IMPLANT
SUT MON AB 2-0 CT1 27 (SUTURE) ×2 IMPLANT
SUT PDS AB 0 CT1 27 (SUTURE) IMPLANT
SUT PLAIN 0 NONE (SUTURE) IMPLANT
SUT VIC AB 0 CT1 36 (SUTURE) IMPLANT
SUT VIC AB 4-0 KS 27 (SUTURE) IMPLANT
TOWEL OR 17X24 6PK STRL BLUE (TOWEL DISPOSABLE) ×2 IMPLANT
TRAY FOLEY W/BAG SLVR 14FR LF (SET/KITS/TRAYS/PACK) IMPLANT
WATER STERILE IRR 1000ML POUR (IV SOLUTION) ×2 IMPLANT

## 2021-01-31 NOTE — Anesthesia Preprocedure Evaluation (Signed)
Anesthesia Evaluation  Patient identified by MRN, date of birth, ID band Patient awake    Reviewed: Allergy & Precautions, Patient's Chart, lab work & pertinent test results  History of Anesthesia Complications Negative for: history of anesthetic complications  Airway Mallampati: II  TM Distance: >3 FB Neck ROM: Full    Dental no notable dental hx.    Pulmonary neg pulmonary ROS,    Pulmonary exam normal        Cardiovascular negative cardio ROS Normal cardiovascular exam     Neuro/Psych  Headaches, negative psych ROS   GI/Hepatic negative GI ROS, Neg liver ROS,   Endo/Other  diabetes, GestationalHypothyroidism   Renal/GU negative Renal ROS  negative genitourinary   Musculoskeletal negative musculoskeletal ROS (+)   Abdominal   Peds  Hematology negative hematology ROS (+)   Anesthesia Other Findings Day of surgery medications reviewed with patient.  Reproductive/Obstetrics (+) Pregnancy                             Anesthesia Physical Anesthesia Plan  ASA: III and emergent  Anesthesia Plan: Spinal   Post-op Pain Management:    Induction:   PONV Risk Score and Plan: 4 or greater and Treatment may vary due to age or medical condition, Ondansetron and Dexamethasone  Airway Management Planned: Natural Airway  Additional Equipment: None  Intra-op Plan:   Post-operative Plan:   Informed Consent: I have reviewed the patients History and Physical, chart, labs and discussed the procedure including the risks, benefits and alternatives for the proposed anesthesia with the patient or authorized representative who has indicated his/her understanding and acceptance.       Plan Discussed with: CRNA  Anesthesia Plan Comments: (Urgent C/S for NRFHT. Stephannie Peters, MD)        Anesthesia Quick Evaluation

## 2021-01-31 NOTE — Anesthesia Procedure Notes (Signed)
Spinal  Patient location during procedure: OR Start time: 01/31/2021 9:16 AM End time: 01/31/2021 9:19 AM Staffing Performed: anesthesiologist  Anesthesiologist: Kaylyn Layer, MD Preanesthetic Checklist Completed: patient identified, IV checked, risks and benefits discussed, monitors and equipment checked, pre-op evaluation and timeout performed Spinal Block Patient position: sitting Prep: DuraPrep and site prepped and draped Patient monitoring: heart rate, continuous pulse ox and blood pressure Approach: midline Location: L3-4 Injection technique: single-shot Needle Needle type: Pencan  Needle gauge: 24 G Needle length: 10 cm Assessment Sensory level: T4 Additional Notes Risks, benefits, and alternative discussed. Patient gave consent to procedure. Prepped and draped in sitting position. Clear CSF obtained after one needle pass. Positive terminal aspiration. No pain or paraesthesias with injection. Patient tolerated procedure well. Vital signs stable. Heather Greenhouse, MD

## 2021-01-31 NOTE — Progress Notes (Signed)
Right edge of pressure dressing not secured to skin, honeycomb under pressure dressing saturated, not adhered  to skin and saturated. Call to Dr. Langston Masker. Order to replace pressure dressing and honeycomb. Steri strips had dried bloody drainage but adhered to skin and left in place. Scant watery bloody drainage mid incision that did not increase with pressure of ABD pad applied. Honeycomb applied with sterile gloves and pressure dressing with ABD pad secured with hypafix tape. Patient tol well.

## 2021-01-31 NOTE — Lactation Note (Signed)
This note was copied from a baby's chart. Lactation Consultation Note  Patient Name: Heather Neal VFIEP'P Date: 01/31/2021 Reason for consult: Initial assessment;Mother's request;NICU baby;Term;Maternal endocrine disorder Age:37 hours  LC set Mom up with DEBP. Mom to pump every 3 hours for 15 minutes. Mom to call RN to transport EBM to NICU.  Mom noted breast changes and colostrum during pregnancy.    Lactation Tools Discussed/Used Tools: Pump;Flanges Flange Size: 24 Breast pump type: Double-Electric Breast Pump Pump Education: Setup, frequency, and cleaning;Milk Storage Reason for Pumping: Increase stimulation Pumping frequency: every 3 hours for 15 minutes  Interventions Interventions: Breast feeding basics reviewed;Expressed milk;Hand express;DEBP  Discharge Pump: Personal WIC Program: No  Consult Status Consult Status: Follow-up Date: 02/01/21 Follow-up type: In-patient    Heather Neal  Heather Neal 01/31/2021, 3:22 PM

## 2021-01-31 NOTE — Transfer of Care (Signed)
Immediate Anesthesia Transfer of Care Note  Patient: Heather Neal  Procedure(s) Performed: CESAREAN SECTION (N/A )  Patient Location: PACU  Anesthesia Type:Spinal  Level of Consciousness: awake, alert  and oriented  Airway & Oxygen Therapy: Patient Spontanous Breathing  Post-op Assessment: Report given to RN and Post -op Vital signs reviewed and stable  Post vital signs: Reviewed and stable  Last Vitals:  Vitals Value Taken Time  BP 108/74 01/31/21 1100  Temp 36.4 C 01/31/21 1037  Pulse 62 01/31/21 1105  Resp 18 01/31/21 1105  SpO2 100 % 01/31/21 1105  Vitals shown include unvalidated device data.  Last Pain:  Vitals:   01/31/21 1045  TempSrc:   PainSc: 0-No pain         Complications: No complications documented.

## 2021-01-31 NOTE — H&P (Signed)
Heather Neal is a 35 y.o. female G1 at [redacted]w[redacted]d with IVF pregnancy presenting for induction of labor.  Patient was admitted overnight and received one dose of VMP at 0113.  She had occasional late decelerations and was given terbutaline around 0600.  Patient continues to have occasional late deceleration which improves with repositioning.  IVF bolus has not improved tracing.  Patient had fetal ECHO for IVF pregnancy which showed complete balanced AV canal defect; s/p MFM consult and cleared for delivery at [facility].  Patient has declined genetic testing.  She is A1DM with good control.  Also, patient has hypothyroidism which is well controlled on levothyroxine 75 mcg.  Rh negative.  GBS negative.  OB History    Gravida  1   Para      Term      Preterm      AB      Living  0     SAB      IAB      Ectopic      Multiple      Live Births             Past Medical History:  Diagnosis Date  . ADD (attention deficit disorder)   . Dysgraphia   . Endometriosis   . Family history of adverse reaction to anesthesia    mom hard time waking up  . Headache   . Hypothyroidism    Past Surgical History:  Procedure Laterality Date  . BREAST SURGERY    . IVF     Family History: family history includes Diabetes in her mother and paternal grandmother; Hypertension in her father. Social History:  reports that she has never smoked. She has never used smokeless tobacco. She reports previous alcohol use. She reports that she does not use drugs.     Maternal Diabetes: Yes:  Diabetes Type:  Diet controlled Genetic Screening: Declined Maternal Ultrasounds/Referrals: Cardiac defect (complete balanced AV canal defect) Fetal Ultrasounds or other Referrals:  Fetal echo, Referred to Materal Fetal Medicine  Maternal Substance Abuse:  No Significant Maternal Medications:  Meds include: Syntroid Significant Maternal Lab Results:  Group B Strep negative and Rh negative Other Comments:  None  Review  of Systems Maternal Medical History:  Contractions: Onset was 3-5 hours ago.    Fetal activity: Perceived fetal activity is normal.   Last perceived fetal movement was within the past hour.    Prenatal complications: no prenatal complications Prenatal Complications - Diabetes: gestational. Diabetes is managed by diet.      Dilation: Closed Effacement (%): 50 Station: -2 Exam by:: Kemontae Dunklee, MD Blood pressure 136/81, pulse 69, temperature 98 F (36.7 C), temperature source Oral, resp. rate 17, height 5\' 6"  (1.676 m), weight 92.3 kg. Maternal Exam:  Uterine Assessment: Contraction strength is mild.  Contraction frequency is irregular.   Abdomen: Patient reports no abdominal tenderness. Fundal height is c/w dates.   Estimated fetal weight is 7#.   Fetal presentation: vertex  Introitus: Normal vulva. Pelvis: adequate for delivery.   Cervix: Cervix evaluated by digital exam.     Fetal Exam Fetal Monitor Review: Baseline rate: 125.  Variability: moderate (6-25 bpm).   Pattern: accelerations present and no decelerations.    Fetal State Assessment: Category I - tracings are normal.     Physical Exam Constitutional:      Appearance: Normal appearance.  HENT:     Head: Normocephalic and atraumatic.  Pulmonary:     Effort: Pulmonary effort is normal.  Abdominal:     Palpations: Abdomen is soft.  Genitourinary:    General: Normal vulva.  Musculoskeletal:        General: Normal range of motion.     Cervical back: Normal range of motion.  Skin:    General: Skin is warm and dry.  Neurological:     Mental Status: She is alert and oriented to person, place, and time.  Psychiatric:        Mood and Affect: Mood normal.        Behavior: Behavior normal.     Prenatal labs: ABO, Rh: --/--/O NEG (02/10 0055) Antibody: NEG (02/10 0055) Rubella:  Immune RPR:   NR HBsAg:   Negative HIV:   NR GBS:   Negative  Assessment/Plan: 34yo G1 at [redacted]w[redacted]d for IOL for IVF pregnancy,  A1DM Given non-reassuring monitoring remote from delivery and inability to continue induction, recommend primary C/S.  Patient is counseled re: risk of bleeding, infection, scarring and damage to surrounding structures.  She is informed of implication in future pregnancies including abnormal placentation and uterine rupture.  All questions were answered and patient wishes to proceed.   Mitchel Honour 01/31/2021, 8:21 AM

## 2021-01-31 NOTE — Lactation Note (Signed)
This note was copied from a baby's chart. Lactation Consultation Note  Patient Name: Heather Neal [id number] Date: 01/31/2021   [facility] went in to meet the Mom and get her set up on DEBP. Mom getting in the wheelchair to go to NICU to see her baby and requested LC support to help with latching.  LC alerted, LC [name], that Mom is on her way and would like help with latching and needs education on how to use DEBP.   [name]  [name] 01/31/2021, 2:34 PM

## 2021-01-31 NOTE — Op Note (Signed)
Heather Neal PROCEDURE DATE: 01/31/2021  PREOPERATIVE DIAGNOSIS: Intrauterine pregnancy at  [redacted]w[redacted]d weeks gestation, non-reassuring fetal monitoring, fetal cardiac malformation  POSTOPERATIVE DIAGNOSIS: The same  PROCEDURE:  Primary Low Transverse Cesarean Section  SURGEON:  Dr. Mitchel Honour  INDICATIONS: Heather Neal is a 35 y.o. G1P0 at [redacted]w[redacted]d scheduled for cesarean section secondary to non-reassuring fetal monitoring after receiving one dose of misoprostol.  The risks of cesarean section discussed with the patient included but were not limited to: bleeding which may require transfusion or reoperation; infection which may require antibiotics; injury to bowel, bladder, ureters or other surrounding organs; injury to the fetus; need for additional procedures including hysterectomy in the event of a life-threatening hemorrhage; placental abnormalities wth subsequent pregnancies, incisional problems, thromboembolic phenomenon and other postoperative/anesthesia complications. The patient concurred with the proposed plan, giving informed written consent for the procedure.    FINDINGS:  Viable female infant in cephalic presentation, APGARs per NICU: weight pending  Clear amniotic fluid.  Intact placenta, three vessel cord.  Grossly normal uterus, ovaries and fallopian tubes. .   ANESTHESIA:  Spinal ESTIMATED BLOOD LOSS: 493 mL ml SPECIMENS: Placenta sent to pathology COMPLICATIONS: None immediate  PROCEDURE IN DETAIL:  The patient received intravenous antibiotics and had sequential compression devices applied to her lower extremities while in the preoperative area.  She was then taken to the operating room where spinal anesthesia was administered and was found to be adequate. She was then placed in a dorsal supine position with a leftward tilt, and prepped and draped in a sterile manner.  A foley catheter was placed into her bladder and attached to constant gravity.  After an adequate timeout was  performed, a Pfannenstiel skin incision was made with scalpel and carried through to the underlying layer of fascia. The fascia was incised in the midline and this incision was extended bilaterally using the Mayo scissors. Kocher clamps were applied to the superior aspect of the fascial incision and the underlying rectus muscles were dissected off bluntly. A similar process was carried out on the inferior aspect of the facial incision. The rectus muscles were separated in the midline bluntly and the peritoneum was entered bluntly.   A transverse hysterotomy was made with a scalpel and extended bilaterally bluntly. The bladder blade was then removed. The infant was successfully delivered, and cord was clamped and cut and infant was handed over to awaiting neonatology team. Uterine massage was then administered and the placenta delivered intact with three-vessel cord. The uterus was cleared of clot and debris.  The hysterotomy was closed with 0 chromic.  A second imbricating suture of 0-chromic was used to reinforce the incision and aid in hemostasis.  The peritoneum and rectus muscles were noted to be hemostatic and were reapproximated using 3-0 monocryl in a running fashion.  The fascia was closed with 0-Vicryl in a running fashion with good restoration of anatomy.  The subcutaneus tissue was copiously irrigated.  The skin was closed with 4-0 vicryl in a subcuticular fashion.  Pt tolerated the procedure will.  All counts were correct x2.  Pt went to the recovery room in stable condition.

## 2021-01-31 NOTE — Anesthesia Postprocedure Evaluation (Signed)
Anesthesia Post Note  Patient: Heather Neal  Procedure(s) Performed: CESAREAN SECTION (N/A )     Patient location during evaluation: PACU Anesthesia Type: Spinal Level of consciousness: awake and alert and oriented Pain management: pain level controlled Vital Signs Assessment: post-procedure vital signs reviewed and stable Respiratory status: spontaneous breathing, nonlabored ventilation and respiratory function stable Cardiovascular status: blood pressure returned to baseline Postop Assessment: no apparent nausea or vomiting, spinal receding, no headache and no backache Anesthetic complications: no   No complications documented.  Last Vitals:  Vitals:   01/31/21 1130 01/31/21 1140  BP: 113/75 121/82  Pulse: (!) 57 (!) 58  Resp: 15 16  Temp: (!) 36.4 C (!) 36.4 C  SpO2: 100% 99%    Last Pain:  Vitals:   01/31/21 1140  TempSrc: Oral  PainSc:    Pain Goal:    LLE Motor Response: Purposeful movement (01/31/21 1130)   RLE Motor Response: Purposeful movement (01/31/21 1130)       Epidural/Spinal Function Cutaneous sensation: Able to Discern Pressure (01/31/21 1130), Patient able to flex knees: Yes (01/31/21 1130), Patient able to lift hips off bed: No (01/31/21 1130), Back pain beyond tenderness at insertion site: No (01/31/21 1130), Progressively worsening motor and/or sensory loss: No (01/31/21 1130), Bowel and/or bladder incontinence post epidural: No (01/31/21 1130)  Kaylyn Layer

## 2021-02-01 ENCOUNTER — Encounter (HOSPITAL_COMMUNITY): Payer: Self-pay | Admitting: Obstetrics & Gynecology

## 2021-02-01 LAB — CBC
HCT: 28.3 % — ABNORMAL LOW (ref 36.0–46.0)
Hemoglobin: 9.6 g/dL — ABNORMAL LOW (ref 12.0–15.0)
MCH: 31.5 pg (ref 26.0–34.0)
MCHC: 33.9 g/dL (ref 30.0–36.0)
MCV: 92.8 fL (ref 80.0–100.0)
Platelets: 161 10*3/uL (ref 150–400)
RBC: 3.05 MIL/uL — ABNORMAL LOW (ref 3.87–5.11)
RDW: 13.4 % (ref 11.5–15.5)
WBC: 15.3 10*3/uL — ABNORMAL HIGH (ref 4.0–10.5)
nRBC: 0 % (ref 0.0–0.2)

## 2021-02-01 MED ORDER — IBUPROFEN 800 MG PO TABS: 800.0000 mg | ORAL_TABLET | Freq: Four times a day (QID) | ORAL | 0 refills | Status: AC

## 2021-02-01 MED ORDER — ACETAMINOPHEN 325 MG PO TABS: 650.0000 mg | ORAL_TABLET | ORAL | 0 refills | Status: AC | PRN

## 2021-02-01 MED ORDER — OXYCODONE HCL 5 MG PO TABS: 5.0000 mg | ORAL_TABLET | ORAL | 0 refills | Status: AC | PRN

## 2021-02-01 NOTE — Clinical Social Work Maternal (Signed)
CLINICAL SOCIAL WORK MATERNAL/CHILD NOTE  Patient Details  Name: Heather Neal MRN: [id number] Date of Birth: 01/02/1986  Date:  02/01/2021  Clinical Social Worker Initiating Note:  Ayliana Casciano, LCSW Date/Time: Initiated:  02/01/21/1243     Child's Name:  Heather Neal   Biological Parents:  Mother,Father (Father: Eric Kloeppel [phone])   Need for Interpreter:  None   Reason for Referral:  Parental Support of Children with Anomalies/Syndromes   Address:  [address] [place] [address]    Phone number:  [phone] (home)     Additional phone number:   Household Members/Support Persons (HM/SP):   Household Member/Support Person 1   HM/SP Name Relationship DOB or Age  HM/SP -1 Eric Monk FOB    HM/SP -2        HM/SP -3        HM/SP -4        HM/SP -5        HM/SP -6        HM/SP -7        HM/SP -8          Natural Supports (not living in the home):  Immediate Family,Extended Family,Church   Professional Supports: None   Employment: Unemployed   Type of Work:     Education:  College graduate   Homebound arranged:    Financial Resources:  Private Insurance   Other Resources:      Cultural/Religious Considerations Which May Impact Care:    Strengths:  Ability to meet basic needs ,Pediatrician chosen,Home prepared for child ,Understanding of illness   Psychotropic Medications:         Pediatrician:    [place] area  Pediatrician List:   [place]    [place] Other ([facility])  [place]    [place]    [place]    [place]      Pediatrician Fax Number:    Risk Factors/Current Problems:  None   Cognitive State:  Able to Concentrate ,Alert ,Goal Oriented ,Insightful ,Linear Thinking    Mood/Affect:  Calm ,Interested ,Comfortable    CSW Assessment: CSW met with parents at bedside to discuss infant's NICU admission and pending trisomy 21 results. CSW introduced self and  explained reason for visit. Parents were calm and remained engaged during assessment. MOB granted CSW verbal permission to speak in front of Husband/FOB about anything. MOB reported that she resides with Husband/FOB. MOB reported that she does some work from home but reports being unemployed. MOB reported that they have all items needed to care for infant including a car seat and crib. CSW inquired about MOB's support system aside from FOB, MOB reported that she has family from out of town and their church as supports.   CSW inquired about MOB's mental health history. MOB denied any mental health history. CSW inquired about how MOB was feeling emotionally after giving birth, MOB reported that she was feeling pretty good. MOB reported that she was a little worried about infant. CSW acknowledged, normalized and validated MOB's feelings. MOB presented calm and did not demonstrate any acute mental health signs/symptoms. CSW assessed for safety, MOB denied SI and HI. CSW did not assess for domestic violence as FOB was present.   CSW provided education regarding the baby blues period vs. perinatal mood disorders, discussed treatment and gave resources for mental health follow up if concerns arise.  CSW recommends self-evaluation during the postpartum time period using the New Mom Checklist   from Postpartum Progress and encouraged MOB to contact a medical professional if symptoms are noted at any time.    CSW provided review of Sudden Infant Death Syndrome (SIDS) precautions.    CSW and parents discussed infant's NICU admission. CSW informed parents about the NICU, what to expect and resources/supports available while infant is admitted to the NICU. MOB reported that they feel well informed about infant's care. MOB spoke about infant's preexisting heart condition and upcoming surgery. CSW inquired about parents feelings surrounding pending trisomy 21 results. MOB reported that they will love infant regardless of the  results. CSW positively affirmed MOB's love for infant and perspective. Parents denied any questions/concerns regarding the NICU.   CSW will continue to offer resources/supports while infant is admitted to the NICU.    CSW Plan/Description:  Psychosocial Support and Ongoing Assessment of Needs,Sudden Infant Death Syndrome (SIDS) Education,Perinatal Mood and Anxiety Disorder (PMADs) Education,Other Patient/Family Education    Thressa Shiffer L Kholton Coate, LCSW 02/01/2021, 12:45 PM  

## 2021-02-01 NOTE — Lactation Note (Signed)
This note was copied from a baby's chart. Lactation Consultation Note  Patient Name: Boy Adaira Centola [id number] Date: 02/01/2021 Reason for consult: NICU baby;Follow-up assessment Age:37 hours  LC to mother's room for f/u visit. Pt is using hand expression and breast pumping to provide colostrum to her baby. She has also latched him in the NICU. Mother is aware that [facility] support is available in the NICU prn. We discussed the importance of frequent breast stimulation today. Mom has a pump at home to use prn and will pump bedside in the NICU. She denies breast pain or difficulty pumping. Patient was provided with the opportunity to ask questions. All concerns were addressed.  Will plan follow up visit in NICU.  Feeding Nipple Type: Dr. Irving [facility]) Working on breastfeeding  LATCH Score Latch: Repeated attempts needed to sustain latch, nipple held in mouth throughout feeding, stimulation needed to elicit sucking reflex.  Audible Swallowing: None  Type of Nipple: Everted at rest and after stimulation  Comfort (Breast/Nipple): Soft / non-tender  Hold (Positioning): Assistance needed to correctly position infant at breast and maintain latch.  LATCH Score: 6   Consult Status Consult Status: Follow-up Follow-up type: In-patient   Elder Negus, MA IBCLC 02/01/2021, 8:15 AM

## 2021-02-01 NOTE — Progress Notes (Signed)
Postpartum Progress Note  Postpartum Day 1 s/p primary Cesarean section.  Subjective:  Patient reports no overnight events.  She reports well controlled pain, ambulating without difficulty, foley removed, awaiting void. Tolerating PO.  She reports Negative flatus, Negative BM.  Vaginal bleeding is minimal.  Objective: Blood pressure 120/72, pulse 71, temperature 98.5 F (36.9 C), temperature source Oral, resp. rate 18, height 5\' 6"  (1.676 m), weight 92.3 kg, SpO2 98 %, unknown if currently breastfeeding.  Physical Exam:  General: alert and no distress Lochia: appropriate Uterine Fundus: firm Incision: dressing in place DVT Evaluation: No evidence of DVT seen on physical exam.  Recent Labs    01/31/21 0100 02/01/21 0614  HGB 11.5* 9.6*  HCT 33.3* 28.3*    Assessment/Plan: . Postpartum Day 1, s/p C-section . Baby in NICU care, cardiac malformation . Lactation following . Patient considering second dose of Moderna COVID vaccine . Doing well, continue routine postpartum care. Anticipate discharge PPD 2 or 3.   LOS: 1 day   04/01/21 02/01/2021, 8:53 AM

## 2021-02-01 NOTE — Progress Notes (Signed)
Mom and dad going to NICU to visit infant. Mom in wheelchair, IV saline locked. Dad pushing mom in wheelchair to NICU unit.

## 2021-02-02 NOTE — Discharge Summary (Signed)
Obstetric Discharge Summary  Heather Neal is a 34 y.o. female that presented on 01/31/2021 for IOL at [redacted]w[redacted]d. Pregnancy course was complicated by IVF pregnancy, abnormal fetal echo with balanced A/V canal defect. Genetic studies were declined. First stage of induction was complicated by late decelerations not improved with position, terbutaline, IVF fluids. Cesarean delivery was recommended for nonreassuring fetal monitoring and baby boy was delivered 01/31/2021. Baby was taken to NICU for cardiac malformation.  Her postpartum course was uncomplicated and on PPD#1, she reported well controlled pain, spontaneous voiding, ambulating without difficulty, and tolerating PO. She was not requiring any narcotics for pain control.  On 02/01/21, she was informed that baby would be transferred to [facility] for additional care. Patient strongly desired discharge and was meeting postoperative milestones as previously described. She was stable for discharge on 02/01/21 with plans for in-office follow up.  Hemoglobin  Date Value Ref Range Status  02/01/2021 9.6 (L) 12.0 - 15.0 g/dL Final   HCT  Date Value Ref Range Status  02/01/2021 28.3 (L) 36.0 - 46.0 % Final    Physical Exam:  General: alert and no distress Lochia: appropriate Uterine Fundus: firm Incision: healing well DVT Evaluation: No evidence of DVT seen on physical exam.  Discharge Diagnoses: Term Pregnancy-delivered  Discharge Information: Date: 02/02/2021 Activity: pelvic rest and as tolerated Diet: routine Medications: tylenol, motrin, oxycodone Condition: stable Instructions: refer to practice specific booklet Discharge to: home  Follow-up Information    [place] Follow up.   Why: Please follow up for postpartum visit at 6 weeks.  Contact information: [address] [place] [address] [phone]               Newborn Data: Live born female  Birth Weight: 5 lb 8.5 oz (2510 g) APGAR: 8,  8  Newborn Delivery   Birth date/time: 01/31/2021 09:41:00 Delivery type: C-Section, Low Transverse Trial of labor: Yes C-section categorization: Primary       Lyn Henri 02/02/2021, 7:29 AM

## 2021-02-04 LAB — SURGICAL PATHOLOGY

## 2022-07-31 IMAGING — US US MFM OB DETAIL+14 WK
1 series · 13 of 28 positions shown · non-contrast
Comparison: none

[Series 1: us mfm ob detail+14 wk · 70 acquisitions, 13 frames shown]
[im 3/70]
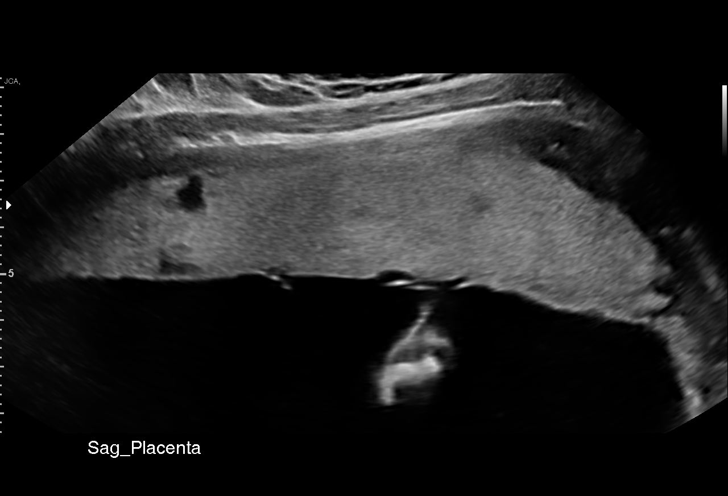
[im 8/70]
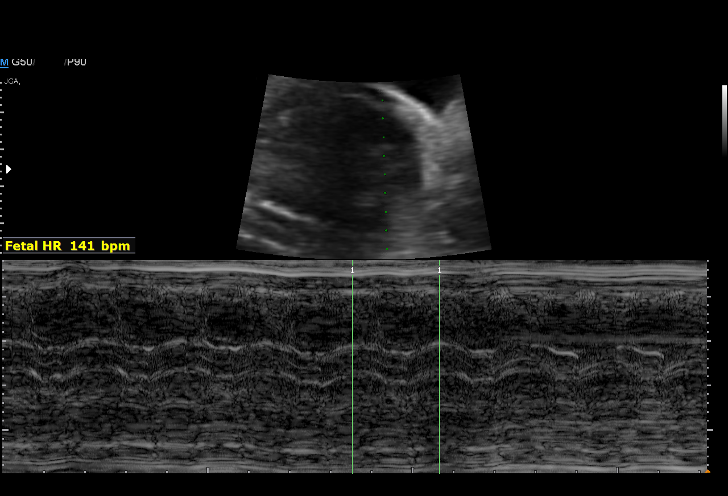
[im 13/70]
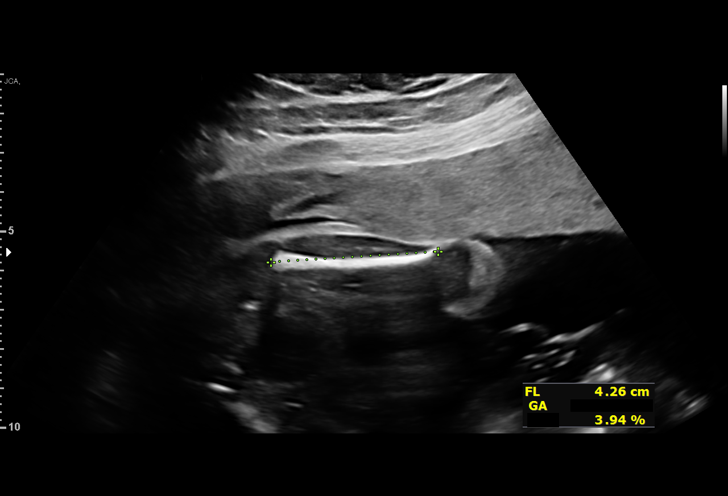
[im 18/70]
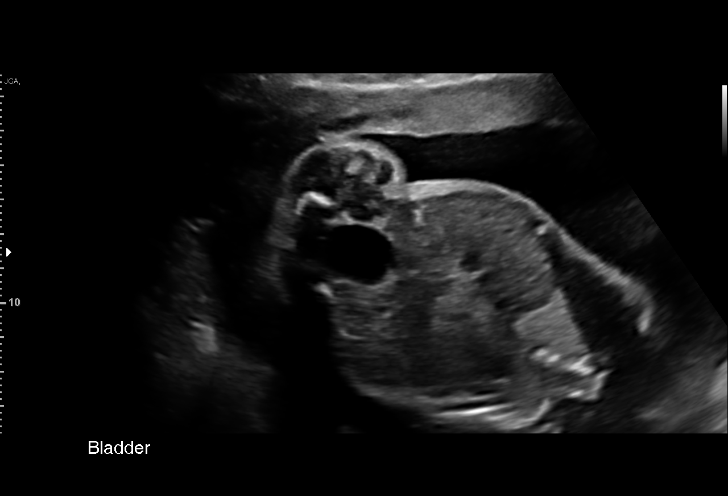
[im 24/70]
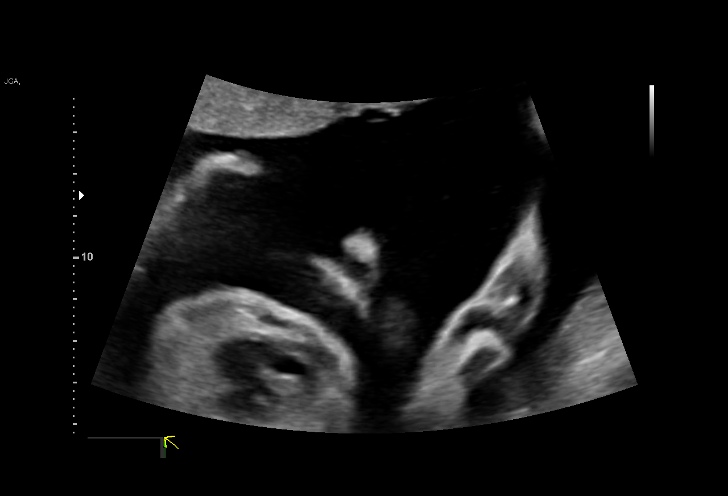
[im 29/70]
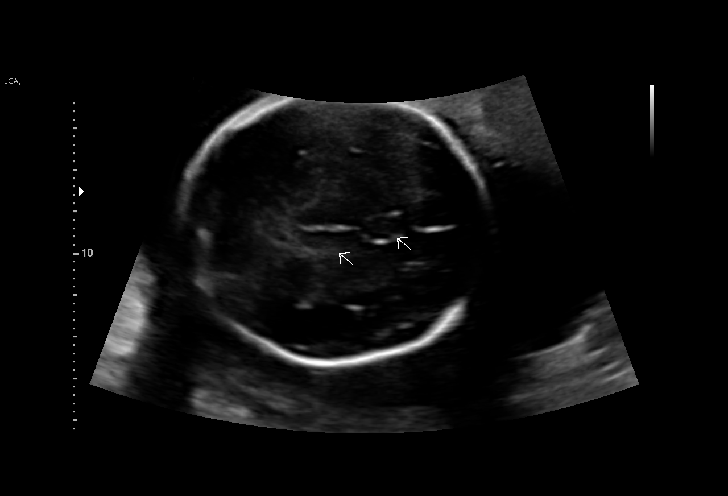
[im 36/70]
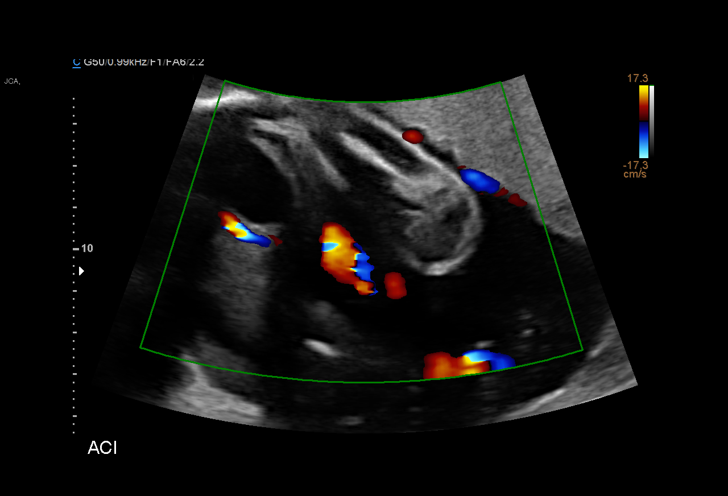
[im 41/70]
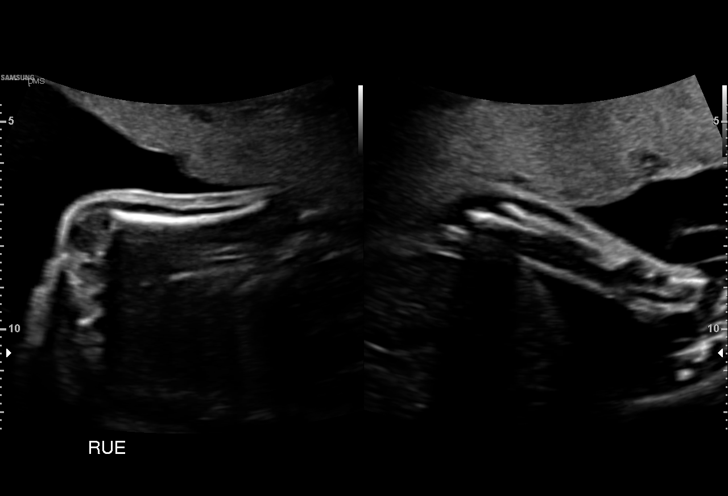
[im 47/70]
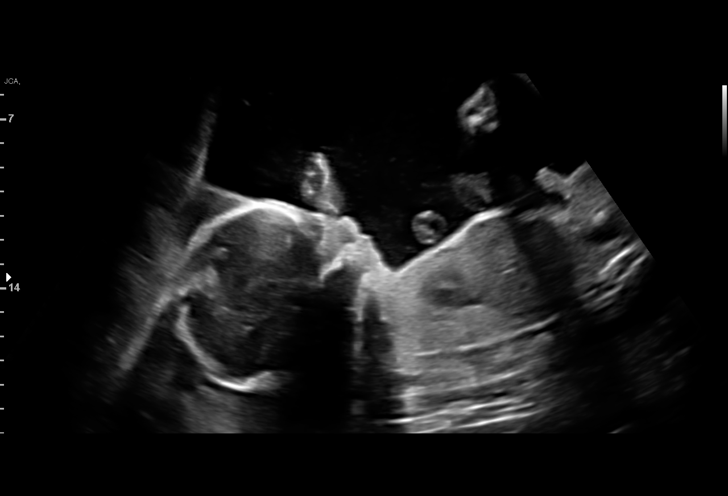
[im 52/70]
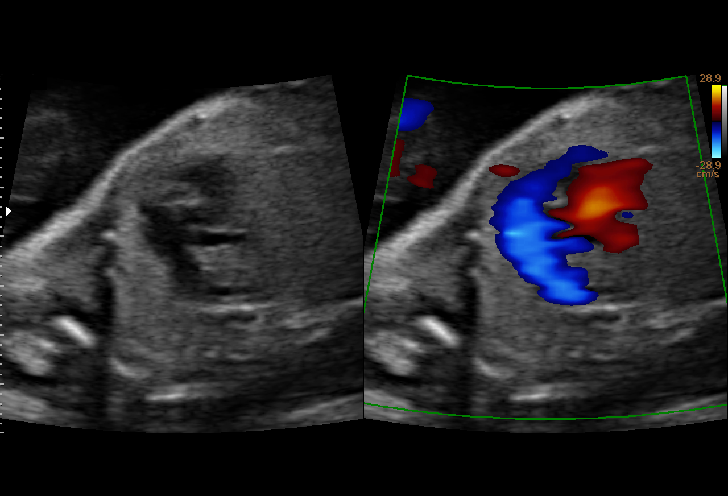
[im 57/70]
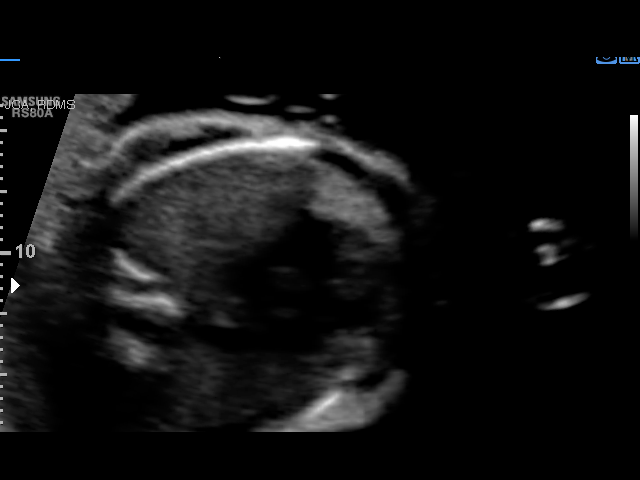
[im 62/70]
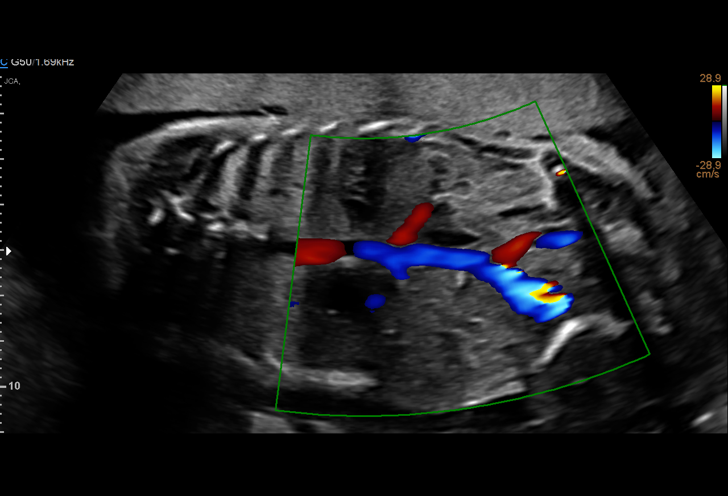
[im 67/70]
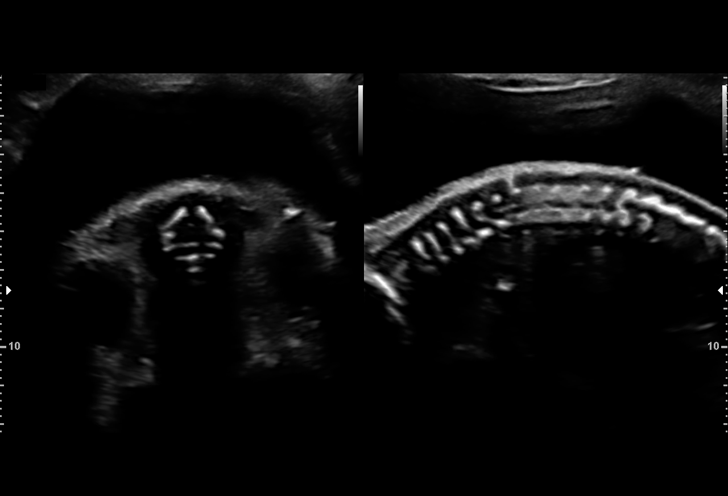

[13 of 28 positions shown; findings below may reference images not displayed]

[REDACTED]. [HOSPITAL]
                   DO

Indications

 Obesity complicating pregnancy, second
 trimester (BMI 31)
 Declined Genetic Testing
 Advanced maternal age multigravida 35+,
 second trimester
 Encounter for congenital cardiac
 abnormalities
 Hypothyroid
 Encounter for antenatal screening for
 malformations
 25 weeks gestation of pregnancy
Fetal Evaluation

 Num Of Fetuses:         1
 Fetal Heart Rate(bpm):  141
 Cardiac Activity:       Observed
 Presentation:           Transverse, head to maternal left
 Placenta:               Anterior
 P. Cord Insertion:      Visualized

 Amniotic Fluid
 AFI FV:      Within normal limits

                             Largest Pocket(cm)

Biometry
 BPD:      64.8  mm     G. Age:  26w 1d         63  %    CI:        77.09   %    70 - 86
                                                         FL/HC:      17.8   %    18.6 -
 HC:      233.7  mm     G. Age:  25w 3d         22  %    HC/AC:      1.05        1.04 -
 AC:      223.5  mm     G. Age:  26w 5d         77  %    FL/BPD:     64.2   %    71 - 87
 FL:       41.6  mm     G. Age:  23w 4d        1.8  %    FL/AC:      18.6   %    20 - 24
 HUM:      37.1  mm     G. Age:  23w 0d        < 5  %
 CER:      28.3  mm     G. Age:  25w 1d         36  %
 NFT:       5.7  mm

 LV:        8.2  mm
 CM:        6.3  mm

 Est. FW:     813  gm    1 lb 13 oz      34  %
OB History

 Gravidity:    1         Term:   0
 Living:       0
Gestational Age

 U/S Today:     25w 3d                                        EDD:   02/03/21
 Best:          25w 4d     Det. By:  Previous Ultrasound      EDD:   02/02/21
                                     (09/12/20)
Anatomy

 Cranium:               Appears normal         Aortic Arch:            Not well visualized
 Cavum:                 Appears normal         Ductal Arch:            Appears normal
 Ventricles:            Appears normal         Diaphragm:              Appears normal
 Choroid Plexus:        Appears normal         Stomach:                Appears normal, left
                                                                       sided
 Cerebellum:            Appears normal         Abdomen:                Appears normal
 Posterior Fossa:       Appears normal         Abdominal Wall:         Appears nml (cord
                                                                       insert, abd wall)
 Nuchal Fold:           Not applicable (>20    Cord Vessels:           Appears normal (3
                        wks GA)                                        vessel cord)
 Face:                  Not well visualized    Kidneys:                Appear normal
 Lips:                  Appears normal         Bladder:                Appears normal
 Thoracic:              Appears normal         Spine:                  Limited views
                                                                       appear normal
 Heart:                 Not well visualized    Upper Extremities:      RUE SEEN; LUE
                                                                       not seen well
 RVOT:                  Not well visualized    Lower Extremities:      Appears normal
 LVOT:                  Not well visualized

 Other:  Normal genaitalia. Fetus appears to be a male. Technically difficult
         due to fetal position.
Cervix Uterus Adnexa

 Cervix
 Length:           3.35  cm.
 Normal appearance by transabdominal scan. Closed
Impression

 Patient is here for a second opinion scan.  On your office
 ultrasound performed at 19 weeks, fetal anatomical survey
 could not be completed and there was a suspicion of cardiac
 anomaly.  Patient conceived by IVF (day 5 transfer).  She has
 hypothyroidism and takes levothyroxine supplements.  She
 reports no other chronic medical conditions.

 We performed fetal anatomy scan. No makers of
 aneuploidies or fetal structural defects are seen.  Cardiac
 anatomy could not be evaluated because of fetal position.
 Patient was asked to ambulate for 15 minutes and fetal
 position did not change.  Fetal biometry is consistent with her
 previously-established dates. Amniotic fluid is normal and
 good fetal activity is seen. Patient understands the limitations
 of ultrasound in detecting fetal anomalies.
 I explained that the inability to evaluate is because of the fetal
 position and not from cardiac abnormalities.
Recommendations

 Patient will be returning on 11/05/2020 for completion of fetal
 anatomy.
                 Coutinho, Kadian

## 2022-10-15 IMAGING — US US MFM FETAL BPP W/ NON-STRESS
1 series · 13 of 28 positions shown · non-contrast
Comparison: none

[Series 1: us mfm fetal bpp w/ non-stress · 55 acquisitions, 13 frames shown]
[im 3/55]
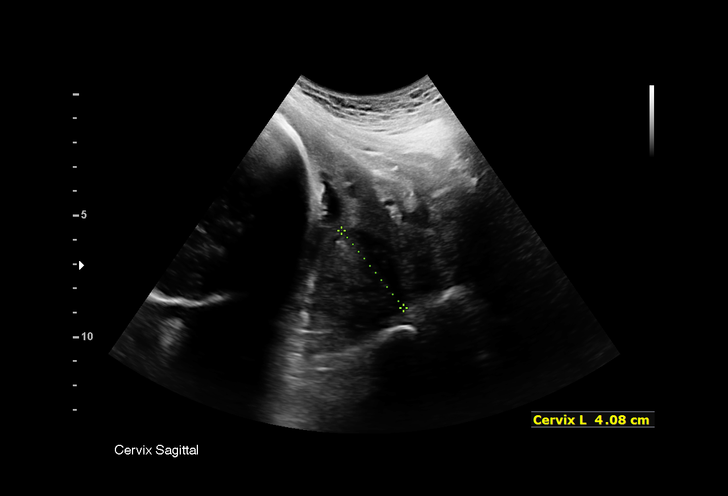
[im 7/55]
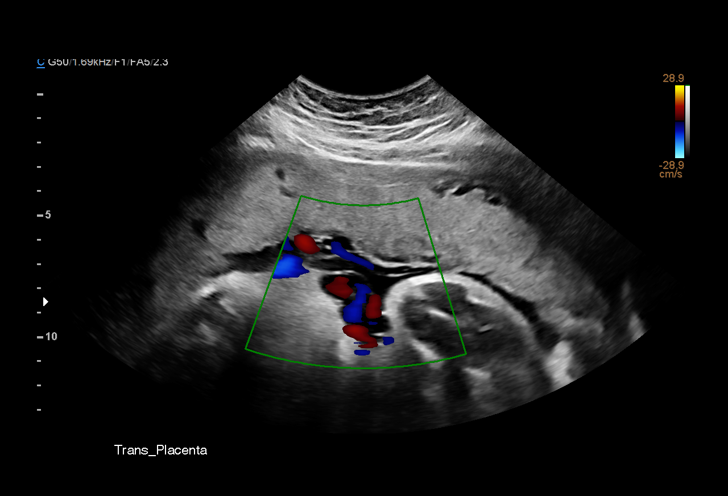
[im 11/55]
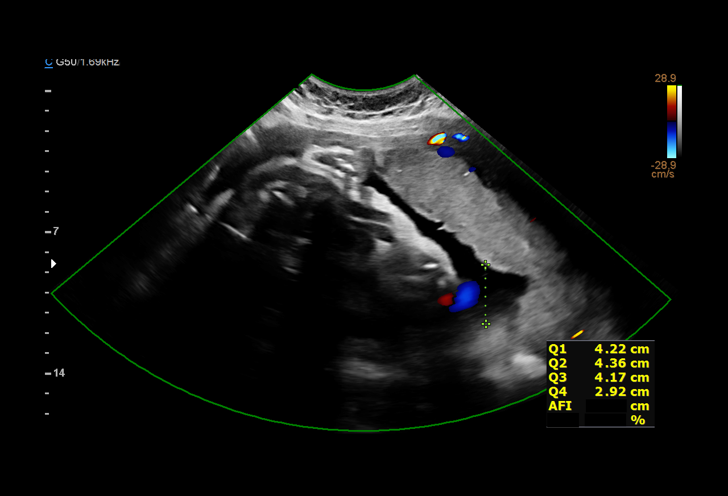
[im 15/55]
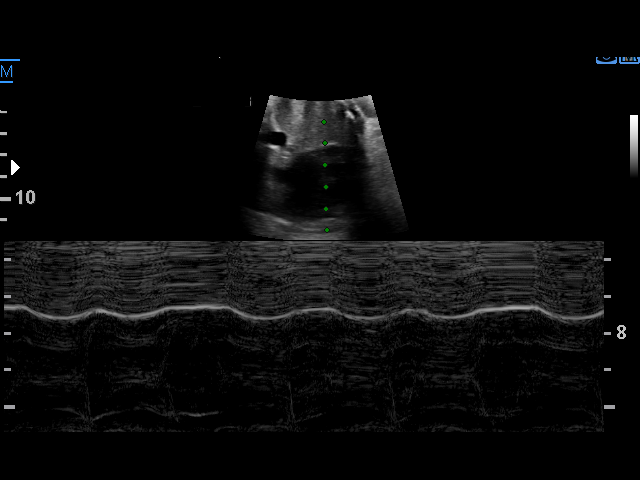
[im 19/55]
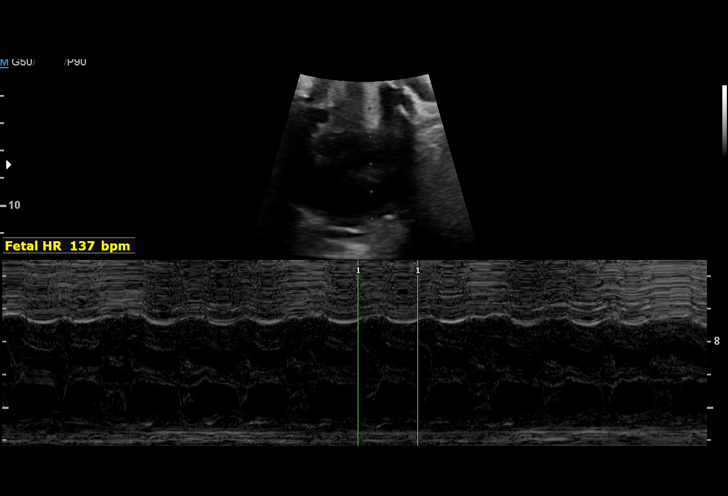
[im 23/55]
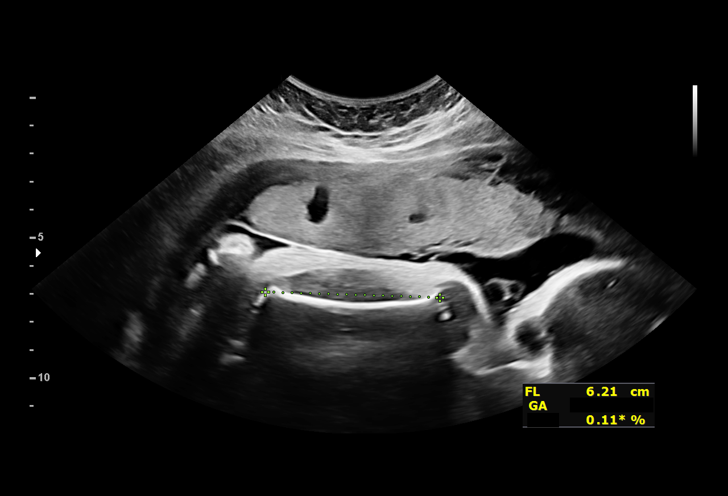
[im 29/55]
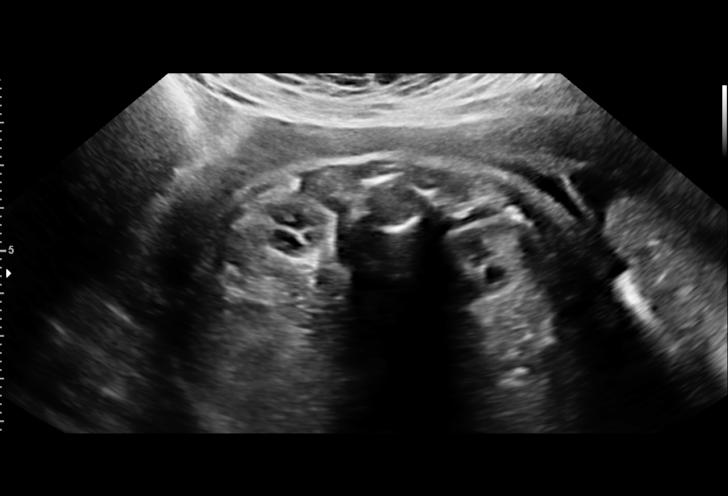
[im 33/55]
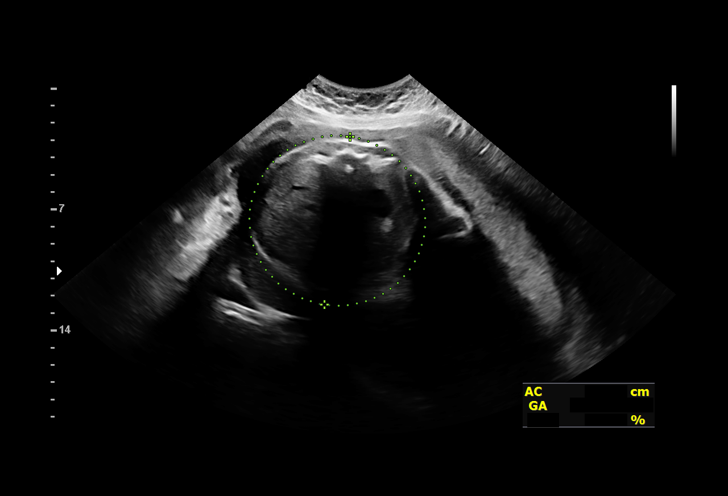
[im 37/55]
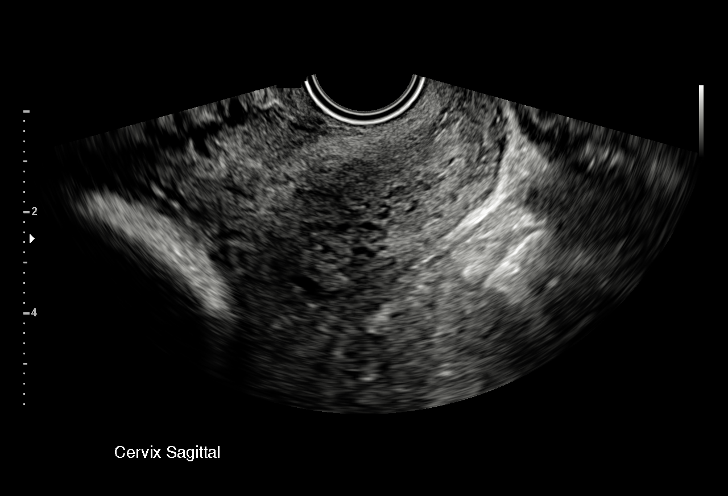
[im 41/55]
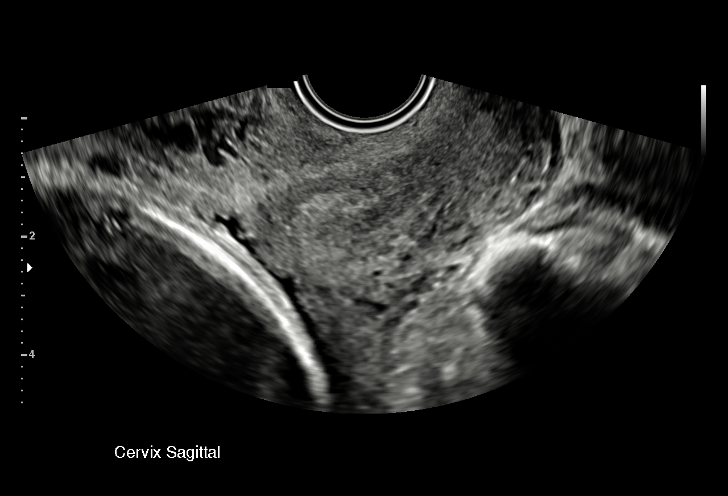
[im 45/55]
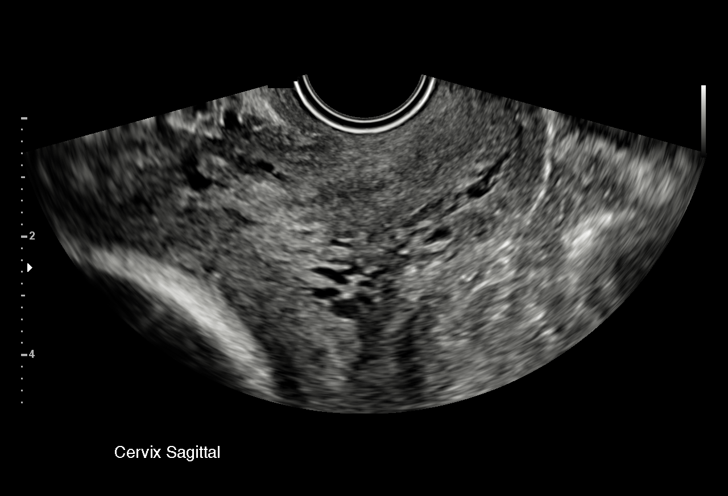
[im 49/55]
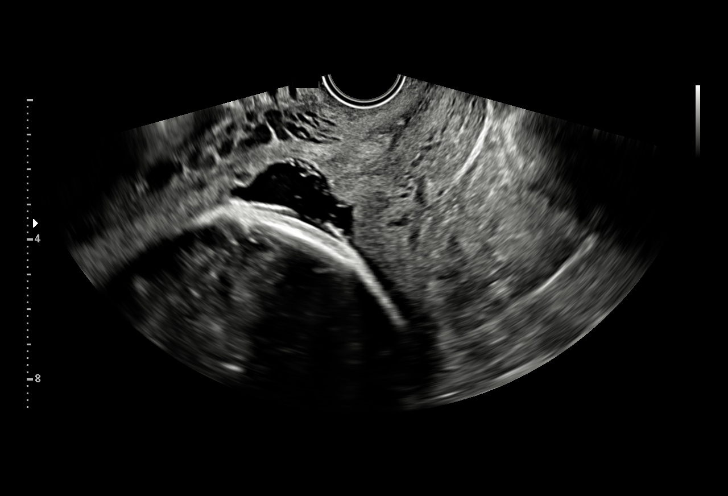
[im 53/55]
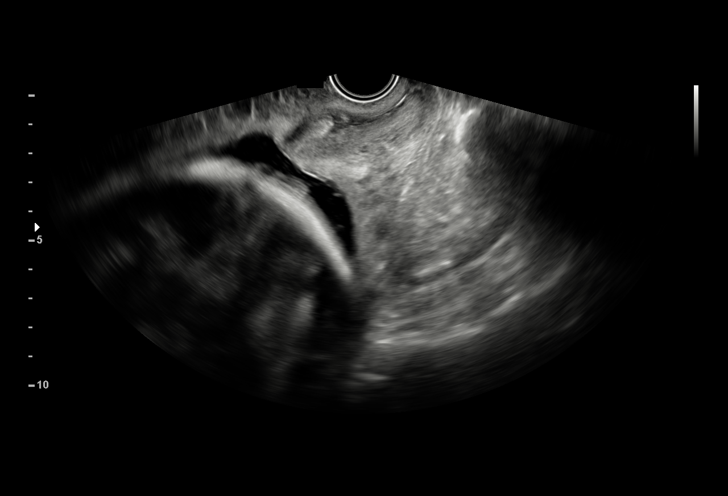

[13 of 28 positions shown; findings below may reference images not displayed]

[REDACTED]. [HOSPITAL]
                   DO

    W/NONSTRESS

Indications

 Placenta previa specified as without
 hemorrhage, third trimester
 Fetal cardiac anomaly affecting pregnancy,
 antepartum
 Advanced maternal age primigravida 35+,
 third trimester
 Obesity complicating pregnancy, third
 trimester (pregravid BMI 31)
 Declined Genetic Testing
 Hypothyroid
 36 weeks gestation of pregnancy
Fetal Evaluation

 Num Of Fetuses:         1
 Fetal Heart Rate(bpm):  138
 Cardiac Activity:       Observed
 Presentation:           Cephalic
 Placenta:               Anterior; no previa
 P. Cord Insertion:      Visualized

 Amniotic Fluid
 AFI FV:      Within normal limits

 AFI Sum(cm)     %Tile       Largest Pocket(cm)
 15.7            58
 RUQ(cm)       RLQ(cm)       LUQ(cm)        LLQ(cm)

Biophysical Evaluation

 Amniotic F.V:   Pocket => 2 cm             F. Tone:        Observed
 F. Movement:    Observed                   N.S.T:          Reactive
 F. Breathing:   Not Observed               Score:          [DATE]
Biometry

 BPD:      90.2  mm     G. Age:  36w 4d         65  %    CI:        78.33   %    70 - 86
                                                         FL/HC:      19.7   %    20.1 -
 HC:      322.4  mm     G. Age:  36w 3d         21  %    HC/AC:      1.02        0.93 -
 AC:      317.2  mm     G. Age:  35w 5d         39  %    FL/BPD:     70.4   %    71 - 87
 FL:       63.5  mm     G. Age:  32w 6d        < 1  %    FL/AC:      20.0   %    20 - 24
 HUM:      54.9  mm     G. Age:  32w 0d        < 5  %

 Est. FW:    9045  gm    5 lb 11 oz      19  %
OB History

 Gravidity:    1         Term:   0
 Living:       0
Gestational Age

 U/S Today:     35w 3d                                        EDD:   02/09/21
 Best:          36w 3d     Det. By:  Previous Ultrasound      EDD:   02/02/21
                                     (09/12/20)
Anatomy

 Cranium:               Appears normal         Aortic Arch:            Previously seen
 Cavum:                 Previously seen        Ductal Arch:            Previously seen
 Ventricles:            Previously seen        Diaphragm:              Appears normal
 Choroid Plexus:        Previously seen        Stomach:                Appears normal, left
                                                                       sided
 Cerebellum:            Previously seen        Abdomen:                Previously seen
 Posterior Fossa:       Previously seen        Abdominal Wall:         Previously seen
 Nuchal Fold:           Not applicable (>20    Cord Vessels:           Previously seen
                        wks GA)
 Face:                  Absent nasal bone      Kidneys:                Appear normal
 Lips:                  Previously seen        Bladder:                Appears normal
 Thoracic:              Previously seen        Spine:                  Limited views
                                                                       previously seen
 Heart:                 Abnormal               Upper Extremities:      Previously seen
 RVOT:                  Previously seen        Lower Extremities:      Previously seen
 LVOT:                  Previously seen

 Other:  Normal genaitalia. Fetus appears to be a male. Technically difficult
         due to fetal position.
Cervix Uterus Adnexa

 Cervix
 Length:           3.62  cm.
 Normal appearance by transvaginal scan
Impression

 Complete balanced AV canal defect.  Patient return for
 antenatal testing.  Low-lying placenta was seen on previous
 ultrasound.

 Amniotic fluid is normal and good fetal activity seen.  Fetal
 breathing movements did not meet the criteria of BPP.  NST
 was performed after transvaginal ultrasound and was
 reactive.  BPP [DATE].

 We performed transvaginal ultrasound.  Cephalic
 presentation.  After displacing the fetal head upward, the
 lower uterine segment was clearly assessed.  The placental
 edge is more than 2 cm from the internal os.
 Findings are not consistent with placenta previa or low-lying
 placenta.

 Patient does not give history of vaginal bleeding.  I explained
 the findings to the couple and reassured them.

 Patient may attempt vaginal delivery.

 I discussed the findings with Dr. Larsen.
Recommendations

 -Patient will be having weekly BPP at your office.
 -Vaginal delivery may be attempted.
 -If significant bleeding occurs during labor, cesarean section
 should be performed because ultrasound has limitations in
 accurately diagnosing placenta previa.
                 Onacram, Don Lolito

## 2024-01-07 ENCOUNTER — Telehealth: Payer: Self-pay | Admitting: *Deleted

## 2024-01-07 ENCOUNTER — Other Ambulatory Visit (HOSPITAL_COMMUNITY): Payer: Self-pay | Admitting: Family Medicine

## 2024-01-07 DIAGNOSIS — R0600 Dyspnea, unspecified: Secondary | ICD-10-CM

## 2024-01-07 DIAGNOSIS — R002 Palpitations: Secondary | ICD-10-CM

## 2024-01-07 DIAGNOSIS — R0602 Shortness of breath: Secondary | ICD-10-CM

## 2024-01-07 NOTE — Telephone Encounter (Signed)
Received fax from [facility] to order 7 Day Zio due to Shortness of breath and Palpitations per Dr. Egbert Garibaldi.

## 2024-01-11 ENCOUNTER — Ambulatory Visit (HOSPITAL_COMMUNITY)
Admission: RE | Admit: 2024-01-11 | Discharge: 2024-01-11 | Disposition: A | Discharge status: Home / Self Care | Payer: Commercial Managed Care - PPO | Source: Ambulatory Visit | Attending: Family Medicine | Admitting: Family Medicine

## 2024-01-11 DIAGNOSIS — R0602 Shortness of breath: Secondary | ICD-10-CM | POA: Insufficient documentation

## 2024-01-13 ENCOUNTER — Ambulatory Visit: Payer: Commercial Managed Care - PPO | Attending: Family Medicine

## 2024-01-13 DIAGNOSIS — R0600 Dyspnea, unspecified: Secondary | ICD-10-CM

## 2024-01-13 DIAGNOSIS — R002 Palpitations: Secondary | ICD-10-CM

## 2024-02-05 DIAGNOSIS — R0600 Dyspnea, unspecified: Secondary | ICD-10-CM

## 2024-02-05 DIAGNOSIS — R002 Palpitations: Secondary | ICD-10-CM
